# Patient Record
Sex: Female | Born: 1950 | Race: White | Hispanic: No | Marital: Single | Smoking: Never smoker
Health system: Southern US, Community
[De-identification: ages and names within clinical notes are randomized; demographics above are authoritative.]

## PROBLEM LIST (undated history)

## (undated) DIAGNOSIS — E785 Hyperlipidemia, unspecified: Secondary | ICD-10-CM

## (undated) DIAGNOSIS — M8580 Other specified disorders of bone density and structure, unspecified site: Secondary | ICD-10-CM

## (undated) DIAGNOSIS — C801 Malignant (primary) neoplasm, unspecified: Secondary | ICD-10-CM

## (undated) DIAGNOSIS — I10 Essential (primary) hypertension: Secondary | ICD-10-CM

## (undated) DIAGNOSIS — R7303 Prediabetes: Secondary | ICD-10-CM

## (undated) HISTORY — PX: KNEE SURGERY: SHX244

## (undated) HISTORY — PX: EXCISIONAL HEMORRHOIDECTOMY: SHX1541

## (undated) HISTORY — DX: Prediabetes: R73.03

## (undated) HISTORY — DX: Malignant (primary) neoplasm, unspecified: C80.1

## (undated) HISTORY — DX: Essential (primary) hypertension: I10

## (undated) HISTORY — DX: Hyperlipidemia, unspecified: E78.5

## (undated) HISTORY — DX: Other specified disorders of bone density and structure, unspecified site: M85.80

---

## 1997-12-27 ENCOUNTER — Other Ambulatory Visit: Admission: RE | Admit: 1997-12-27 | Discharge: 1997-12-27 | Payer: Self-pay | Admitting: *Deleted

## 1999-01-11 ENCOUNTER — Other Ambulatory Visit: Admission: RE | Admit: 1999-01-11 | Discharge: 1999-01-11 | Payer: Self-pay | Admitting: *Deleted

## 1999-01-24 ENCOUNTER — Encounter: Payer: Self-pay | Admitting: Internal Medicine

## 1999-01-24 ENCOUNTER — Encounter: Admission: RE | Admit: 1999-01-24 | Discharge: 1999-01-24 | Payer: Self-pay | Admitting: Internal Medicine

## 1999-03-01 ENCOUNTER — Other Ambulatory Visit: Admission: RE | Admit: 1999-03-01 | Discharge: 1999-03-01 | Payer: Self-pay | Admitting: *Deleted

## 1999-08-28 ENCOUNTER — Other Ambulatory Visit: Admission: RE | Admit: 1999-08-28 | Discharge: 1999-08-28 | Payer: Self-pay | Admitting: *Deleted

## 2000-01-18 ENCOUNTER — Other Ambulatory Visit: Admission: RE | Admit: 2000-01-18 | Discharge: 2000-01-18 | Payer: Self-pay | Admitting: *Deleted

## 2000-01-26 ENCOUNTER — Encounter: Admission: RE | Admit: 2000-01-26 | Discharge: 2000-01-26 | Payer: Self-pay | Admitting: Internal Medicine

## 2000-01-26 ENCOUNTER — Encounter: Payer: Self-pay | Admitting: Internal Medicine

## 2001-01-20 ENCOUNTER — Other Ambulatory Visit: Admission: RE | Admit: 2001-01-20 | Discharge: 2001-01-20 | Payer: Self-pay | Admitting: *Deleted

## 2001-01-27 ENCOUNTER — Encounter: Payer: Self-pay | Admitting: *Deleted

## 2001-01-27 ENCOUNTER — Encounter: Admission: RE | Admit: 2001-01-27 | Discharge: 2001-01-27 | Payer: Self-pay | Admitting: *Deleted

## 2002-01-22 ENCOUNTER — Other Ambulatory Visit: Admission: RE | Admit: 2002-01-22 | Discharge: 2002-01-22 | Payer: Self-pay | Admitting: *Deleted

## 2002-02-10 ENCOUNTER — Encounter: Admission: RE | Admit: 2002-02-10 | Discharge: 2002-02-10 | Payer: Self-pay | Admitting: *Deleted

## 2002-02-10 ENCOUNTER — Encounter: Payer: Self-pay | Admitting: *Deleted

## 2003-01-25 ENCOUNTER — Other Ambulatory Visit: Admission: RE | Admit: 2003-01-25 | Discharge: 2003-01-25 | Payer: Self-pay | Admitting: *Deleted

## 2003-11-17 ENCOUNTER — Ambulatory Visit: Payer: Self-pay | Admitting: Gastroenterology

## 2004-01-19 ENCOUNTER — Ambulatory Visit: Payer: Self-pay | Admitting: Gastroenterology

## 2004-02-15 ENCOUNTER — Other Ambulatory Visit: Admission: RE | Admit: 2004-02-15 | Discharge: 2004-02-15 | Payer: Self-pay | Admitting: *Deleted

## 2005-03-05 ENCOUNTER — Other Ambulatory Visit: Admission: RE | Admit: 2005-03-05 | Discharge: 2005-03-05 | Payer: Self-pay | Admitting: *Deleted

## 2006-03-20 ENCOUNTER — Other Ambulatory Visit: Admission: RE | Admit: 2006-03-20 | Discharge: 2006-03-20 | Payer: Self-pay | Admitting: *Deleted

## 2007-03-24 ENCOUNTER — Other Ambulatory Visit: Admission: RE | Admit: 2007-03-24 | Discharge: 2007-03-24 | Payer: Self-pay | Admitting: *Deleted

## 2010-01-28 ENCOUNTER — Encounter: Payer: Self-pay | Admitting: *Deleted

## 2011-01-19 ENCOUNTER — Encounter: Payer: Self-pay | Admitting: Gastroenterology

## 2014-01-08 DIAGNOSIS — C801 Malignant (primary) neoplasm, unspecified: Secondary | ICD-10-CM

## 2014-01-08 HISTORY — DX: Malignant (primary) neoplasm, unspecified: C80.1

## 2014-01-11 ENCOUNTER — Encounter: Payer: Self-pay | Admitting: Gastroenterology

## 2014-03-04 ENCOUNTER — Ambulatory Visit (AMBULATORY_SURGERY_CENTER): Payer: Self-pay

## 2014-03-04 VITALS — Ht 58.5 in | Wt 140.0 lb

## 2014-03-04 DIAGNOSIS — Z800 Family history of malignant neoplasm of digestive organs: Secondary | ICD-10-CM

## 2014-03-04 MED ORDER — NA SULFATE-K SULFATE-MG SULF 17.5-3.13-1.6 GM/177ML PO SOLN: 1.0000 | Freq: Once | ORAL | Status: DC

## 2014-03-04 NOTE — Progress Notes (Signed)
No allergies to eggs or soy No home oxygen No diet/weight loss meds No past problems with anesthesia  No internet 

## 2014-03-18 ENCOUNTER — Encounter: Payer: Self-pay | Admitting: Gastroenterology

## 2014-03-18 ENCOUNTER — Ambulatory Visit (AMBULATORY_SURGERY_CENTER): Payer: 59 | Admitting: Gastroenterology

## 2014-03-18 VITALS — BP 90/58 | HR 73 | Temp 98.0°F | Resp 16 | Ht <= 58 in | Wt 140.0 lb

## 2014-03-18 DIAGNOSIS — Z800 Family history of malignant neoplasm of digestive organs: Secondary | ICD-10-CM

## 2014-03-18 DIAGNOSIS — K5730 Diverticulosis of large intestine without perforation or abscess without bleeding: Secondary | ICD-10-CM

## 2014-03-18 DIAGNOSIS — Z1211 Encounter for screening for malignant neoplasm of colon: Secondary | ICD-10-CM

## 2014-03-18 MED ORDER — SODIUM CHLORIDE 0.9 % IV SOLN: 500.0000 mL | INTRAVENOUS | Status: DC

## 2014-03-18 NOTE — Patient Instructions (Signed)
YOU HAD AN ENDOSCOPIC PROCEDURE TODAY AT THE [facility]:   Refer to the procedure report that was given to you for any specific questions about what was found during the examination.  If the procedure report does not answer your questions, please call your gastroenterologist to clarify.  If you requested that your care partner not be given the details of your procedure findings, then the procedure report has been included in a sealed envelope for you to review at your convenience later.  YOU SHOULD EXPECT: Some feelings of bloating in the abdomen. Passage of more gas than usual.  Walking can help get rid of the air that was put into your GI tract during the procedure and reduce the bloating. If you had a lower endoscopy (such as a colonoscopy or flexible sigmoidoscopy) you may notice spotting of blood in your stool or on the toilet paper. If you underwent a bowel prep for your procedure, you may not have a normal bowel movement for a few days.  Please Note:  You might notice some irritation and congestion in your nose or some drainage.  This is from the oxygen used during your procedure.  There is no need for concern and it should clear up in a day or so.  SYMPTOMS TO REPORT IMMEDIATELY:   Following lower endoscopy (colonoscopy or flexible sigmoidoscopy):  Excessive amounts of blood in the stool  Significant tenderness or worsening of abdominal pains  Swelling of the abdomen that is new, acute  Fever of 100F or higher    For urgent or emergent issues, a gastroenterologist can be reached at any hour by calling [phone].   DIET: Your first meal following the procedure should be a small meal and then it is ok to progress to your normal diet. Heavy or fried foods are harder to digest and may make you feel nauseous or bloated.  Likewise, meals heavy in dairy and vegetables can increase bloating.  Drink plenty of fluids but you should avoid alcoholic beverages for 24  hours.  ACTIVITY:  You should plan to take it easy for the rest of today and you should NOT DRIVE or use heavy machinery until tomorrow (because of the sedation medicines used during the test).    FOLLOW UP: Our staff will call the number listed on your records the next business day following your procedure to check on you and address any questions or concerns that you may have regarding the information given to you following your procedure. If we do not reach you, we will leave a message.  However, if you are feeling well and you are not experiencing any problems, there is no need to return our call.  We will assume that you have returned to your regular daily activities without incident.  If any biopsies were taken you will be contacted by phone or by letter within the next 1-3 weeks.  Please call us at [phone] if you have not heard about the biopsies in 3 weeks.    SIGNATURES/CONFIDENTIALITY: You and/or your care partner have signed paperwork which will be entered into your electronic medical record.  These signatures attest to the fact that that the information above on your After Visit Summary has been reviewed and is understood.  Full responsibility of the confidentiality of this discharge information lies with you and/or your care-partner.   INFORMATION ON DIVERTICULOSIS GIVEN TO YOU TODAY 

## 2014-03-18 NOTE — Op Note (Signed)
[place]  [address] [place], [id number]   COLONOSCOPY PROCEDURE REPORT  PATIENT: Caitlin, Price  MR#: [id number] BIRTHDATE: October 16, 1950 , 83  yrs. old GENDER: female ENDOSCOPIST: Inda Castle, MD REFERRED BY: PROCEDURE DATE:  03/18/2014 PROCEDURE:   Colonoscopy, diagnostic First Screening Colonoscopy - Avg.  risk and is 50 yrs.  old or older - No.  Prior Negative Screening - Now for repeat screening. 10 or more years since last screening  History of Adenoma - Now for follow-up colonoscopy & has been > or = to 3 yrs.  N/A ASA CLASS:   Class II INDICATIONS:Colorectal Neoplasm Risk Assessment for this procedure is average risk. MEDICATIONS: Monitored anesthesia care and Propofol 300 mg IV  DESCRIPTION OF PROCEDURE:   After the risks benefits and alternatives of the procedure were thoroughly explained, informed consent was obtained.  The digital rectal exam revealed no abnormalities of the rectum.   The LB [id number]  endoscope was introduced through the anus and advanced to the cecum, which was identified by both the appendix and ileocecal valve. No adverse events experienced.   The quality of the prep was (Suprep was used) excellent.  The instrument was then slowly withdrawn as the colon was fully examined.      COLON FINDINGS: There was mild diverticulosis noted in the sigmoid colon.   The examination was otherwise normal.  Retroflexed views revealed no abnormalities. The time to cecum = 3.2 Withdrawal time = 6.8   The scope was withdrawn and the procedure completed. COMPLICATIONS: There were no immediate complications.  ENDOSCOPIC IMPRESSION: 1.   Mild diverticulosis was noted in the sigmoid colon 2.   The examination was otherwise normal  RECOMMENDATIONS: Continue current colorectal screening recommendations for "routine risk" patients with a repeat colonoscopy in 10 years.  eSigned:  Inda Castle, MD 03/18/2014 2:01 PM   cc:  Wenda Low, MD

## 2014-03-18 NOTE — Progress Notes (Signed)
Stable to RR 

## 2015-10-13 ENCOUNTER — Ambulatory Visit: Payer: Self-pay | Admitting: Allergy & Immunology

## 2015-10-14 ENCOUNTER — Ambulatory Visit: Payer: Self-pay | Admitting: Allergy

## 2015-11-07 ENCOUNTER — Ambulatory Visit: Payer: Self-pay | Admitting: Allergy and Immunology

## 2016-08-29 ENCOUNTER — Ambulatory Visit (INDEPENDENT_AMBULATORY_CARE_PROVIDER_SITE_OTHER): Payer: BLUE CROSS/BLUE SHIELD | Admitting: Obstetrics & Gynecology

## 2016-08-29 ENCOUNTER — Encounter: Payer: Self-pay | Admitting: Obstetrics & Gynecology

## 2016-08-29 VITALS — BP 138/86 | Ht 59.0 in | Wt 138.0 lb

## 2016-08-29 DIAGNOSIS — N952 Postmenopausal atrophic vaginitis: Secondary | ICD-10-CM | POA: Diagnosis not present

## 2016-08-29 DIAGNOSIS — Z01411 Encounter for gynecological examination (general) (routine) with abnormal findings: Secondary | ICD-10-CM | POA: Diagnosis not present

## 2016-08-29 DIAGNOSIS — Z780 Asymptomatic menopausal state: Secondary | ICD-10-CM

## 2016-08-29 NOTE — Patient Instructions (Signed)
1. Encounter for gynecological examination with abnormal finding Gyn exam with Atrophic vaginitis.  Pap normal 08/2015.  Breasts wnl.  Screening Mammo neg 07/2016 at [facility].  Up to date with Colonoscopy.  2. Menopause present No HRT.  No PMB.  Osteopenia bilateral hips on BD 07/2015.  Takes Vit D supplements.  Ca++ in nutrition recommended.  Weight bearing physical activity.  3. Post-menopausal atrophic vaginitis Asymptomatic.  Abstinent.  Caitlin Price, it was a pleasure to see you today!   Health Maintenance for Postmenopausal Women Menopause is a normal process in which your reproductive ability comes to an end. This process happens gradually over a span of months to years, usually between the ages of 104 and 92. Menopause is complete when you have missed 12 consecutive menstrual periods. It is important to talk with your health care provider about some of the most common conditions that affect postmenopausal women, such as heart disease, cancer, and bone loss (osteoporosis). Adopting a healthy lifestyle and getting preventive care can help to promote your health and wellness. Those actions can also lower your chances of developing some of these common conditions. What should I know about menopause? During menopause, you may experience a number of symptoms, such as:  Moderate-to-severe hot flashes.  Night sweats.  Decrease in sex drive.  Mood swings.  Headaches.  Tiredness.  Irritability.  Memory problems.  Insomnia.  Choosing to treat or not to treat menopausal changes is an individual decision that you make with your health care provider. What should I know about hormone replacement therapy and supplements? Hormone therapy products are effective for treating symptoms that are associated with menopause, such as hot flashes and night sweats. Hormone replacement carries certain risks, especially as you become older. If you are thinking about using estrogen or estrogen with progestin  treatments, discuss the benefits and risks with your health care provider. What should I know about heart disease and stroke? Heart disease, heart attack, and stroke become more likely as you age. This may be due, in part, to the hormonal changes that your body experiences during menopause. These can affect how your body processes dietary fats, triglycerides, and cholesterol. Heart attack and stroke are both medical emergencies. There are many things that you can do to help prevent heart disease and stroke:  Have your blood pressure checked at least every 1-2 years. High blood pressure causes heart disease and increases the risk of stroke.  If you are 37-28 years old, ask your health care provider if you should take aspirin to prevent a heart attack or a stroke.  Do not use any tobacco products, including cigarettes, chewing tobacco, or electronic cigarettes. If you need help quitting, ask your health care provider.  It is important to eat a healthy diet and maintain a healthy weight. ? Be sure to include plenty of vegetables, fruits, low-fat dairy products, and lean protein. ? Avoid eating foods that are high in solid fats, added sugars, or salt (sodium).  Get regular exercise. This is one of the most important things that you can do for your health. ? Try to exercise for at least 150 minutes each week. The type of exercise that you do should increase your heart rate and make you sweat. This is known as moderate-intensity exercise. ? Try to do strengthening exercises at least twice each week. Do these in addition to the moderate-intensity exercise.  Know your numbers.Ask your health care provider to check your cholesterol and your blood glucose. Continue to have your  blood tested as directed by your health care provider.  What should I know about cancer screening? There are several types of cancer. Take the following steps to reduce your risk and to catch any cancer development as early as  possible. Breast Cancer  Practice breast self-awareness. ? This means understanding how your breasts normally appear and feel. ? It also means doing regular breast self-exams. Let your health care provider know about any changes, no matter how small.  If you are 36 or older, have a clinician do a breast exam (clinical breast exam or CBE) every year. Depending on your age, family history, and medical history, it may be recommended that you also have a yearly breast X-ray (mammogram).  If you have a family history of breast cancer, talk with your health care provider about genetic screening.  If you are at high risk for breast cancer, talk with your health care provider about having an MRI and a mammogram every year.  Breast cancer (BRCA) gene test is recommended for women who have family members with BRCA-related cancers. Results of the assessment will determine the need for genetic counseling and BRCA1 and for BRCA2 testing. BRCA-related cancers include these types: ? Breast. This occurs in males or females. ? Ovarian. ? Tubal. This may also be called fallopian tube cancer. ? Cancer of the abdominal or pelvic lining (peritoneal cancer). ? Prostate. ? Pancreatic.  Cervical, Uterine, and Ovarian Cancer Your health care provider may recommend that you be screened regularly for cancer of the pelvic organs. These include your ovaries, uterus, and vagina. This screening involves a pelvic exam, which includes checking for microscopic changes to the surface of your cervix (Pap test).  For women ages 21-65, health care providers may recommend a pelvic exam and a Pap test every three years. For women ages 27-65, they may recommend the Pap test and pelvic exam, combined with testing for human papilloma virus (HPV), every five years. Some types of HPV increase your risk of cervical cancer. Testing for HPV may also be done on women of any age who have unclear Pap test results.  Other health care  providers may not recommend any screening for nonpregnant women who are considered low risk for pelvic cancer and have no symptoms. Ask your health care provider if a screening pelvic exam is right for you.  If you have had past treatment for cervical cancer or a condition that could lead to cancer, you need Pap tests and screening for cancer for at least 20 years after your treatment. If Pap tests have been discontinued for you, your risk factors (such as having a new sexual partner) need to be reassessed to determine if you should start having screenings again. Some women have medical problems that increase the chance of getting cervical cancer. In these cases, your health care provider may recommend that you have screening and Pap tests more often.  If you have a family history of uterine cancer or ovarian cancer, talk with your health care provider about genetic screening.  If you have vaginal bleeding after reaching menopause, tell your health care provider.  There are currently no reliable tests available to screen for ovarian cancer.  Lung Cancer Lung cancer screening is recommended for adults 58-20 years old who are at high risk for lung cancer because of a history of smoking. A yearly low-dose CT scan of the lungs is recommended if you:  Currently smoke.  Have a history of at least 30 pack-years of smoking  and you currently smoke or have quit within the past 15 years. A pack-year is smoking an average of one pack of cigarettes per day for one year.  Yearly screening should:  Continue until it has been 15 years since you quit.  Stop if you develop a health problem that would prevent you from having lung cancer treatment.  Colorectal Cancer  This type of cancer can be detected and can often be prevented.  Routine colorectal cancer screening usually begins at age 86 and continues through age 34.  If you have risk factors for colon cancer, your health care provider may recommend  that you be screened at an earlier age.  If you have a family history of colorectal cancer, talk with your health care provider about genetic screening.  Your health care provider may also recommend using home test kits to check for hidden blood in your stool.  A small camera at the end of a tube can be used to examine your colon directly (sigmoidoscopy or colonoscopy). This is done to check for the earliest forms of colorectal cancer.  Direct examination of the colon should be repeated every 5-10 years until age 51. However, if early forms of precancerous polyps or small growths are found or if you have a family history or genetic risk for colorectal cancer, you may need to be screened more often.  Skin Cancer  Check your skin from head to toe regularly.  Monitor any moles. Be sure to tell your health care provider: ? About any new moles or changes in moles, especially if there is a change in a mole's shape or color. ? If you have a mole that is larger than the size of a pencil eraser.  If any of your family members has a history of skin cancer, especially at a young age, talk with your health care provider about genetic screening.  Always use sunscreen. Apply sunscreen liberally and repeatedly throughout the day.  Whenever you are outside, protect yourself by wearing long sleeves, pants, a wide-brimmed hat, and sunglasses.  What should I know about osteoporosis? Osteoporosis is a condition in which bone destruction happens more quickly than new bone creation. After menopause, you may be at an increased risk for osteoporosis. To help prevent osteoporosis or the bone fractures that can happen because of osteoporosis, the following is recommended:  If you are 55-31 years old, get at least 1,000 mg of calcium and at least 600 mg of vitamin D per day.  If you are older than age 53 but younger than age 22, get at least 1,200 mg of calcium and at least 600 mg of vitamin D per day.  If you  are older than age 60, get at least 1,200 mg of calcium and at least 800 mg of vitamin D per day.  Smoking and excessive alcohol intake increase the risk of osteoporosis. Eat foods that are rich in calcium and vitamin D, and do weight-bearing exercises several times each week as directed by your health care provider. What should I know about how menopause affects my mental health? Depression may occur at any age, but it is more common as you become older. Common symptoms of depression include:  Low or sad mood.  Changes in sleep patterns.  Changes in appetite or eating patterns.  Feeling an overall lack of motivation or enjoyment of activities that you previously enjoyed.  Frequent crying spells.  Talk with your health care provider if you think that you are experiencing  depression. What should I know about immunizations? It is important that you get and maintain your immunizations. These include:  Tetanus, diphtheria, and pertussis (Tdap) booster vaccine.  Influenza every year before the flu season begins.  Pneumonia vaccine.  Shingles vaccine.  Your health care provider may also recommend other immunizations. This information is not intended to replace advice given to you by your health care provider. Make sure you discuss any questions you have with your health care provider. Document Released: 02/16/2005 Document Revised: 07/15/2015 Document Reviewed: 09/28/2014 Elsevier Interactive Patient Education  2018 [employer].

## 2016-08-29 NOTE — Progress Notes (Signed)
Caitlin Price 1950/04/06 [id number]   History:    66 y.o. G0 single.  Abstinent.  Retired.  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  RP:  Established patient presenting for annual gyn exam  HPI:  Long standing Menopause.  No HRT.  No PMB.  Asymptomatic.  No pelvic pain.  Breasts wnl.  Mictions normal.  BMs wnl.  Walks her dogs.   Past medical history,surgical history, family history and social history were all reviewed and documented in the EPIC chart.  Gynecologic History No LMP recorded. Patient is postmenopausal. Contraception: abstinence and post menopausal status Last Pap: 08/2015. Results were: normal Last mammogram: 07/2016. Results were: Negative Bone Density Osteopenia at hips 07/2015 [facility] up to date  Obstetric History OB History  Gravida Para Term Preterm AB Living  0 0 0 0 0 0  SAB TAB Ectopic Multiple Live Births  0 0 0 0 0         ROS: A ROS was performed and pertinent positives and negatives are included in the history.  GENERAL: No fevers or chills. HEENT: No change in vision, no earache, sore throat or sinus congestion. NECK: No pain or stiffness. CARDIOVASCULAR: No chest pain or pressure. No palpitations. PULMONARY: No shortness of breath, cough or wheeze. GASTROINTESTINAL: No abdominal pain, nausea, vomiting or diarrhea, melena or bright red blood per rectum. GENITOURINARY: No urinary frequency, urgency, hesitancy or dysuria. MUSCULOSKELETAL: No joint or muscle pain, no back pain, no recent trauma. DERMATOLOGIC: No rash, no itching, no lesions. ENDOCRINE: No polyuria, polydipsia, no heat or cold intolerance. No recent change in weight. HEMATOLOGICAL: No anemia or easy bruising or bleeding. NEUROLOGIC: No headache, seizures, numbness, tingling or weakness. PSYCHIATRIC: No depression, no loss of interest in normal activity or change in sleep pattern.     Exam:   BP 138/86   Ht 4\' 11"  (1.499 m)   Wt 138 lb (62.6 kg)   BMI 27.87 kg/m   Body mass index is  27.87 kg/m.  General appearance : Well developed well nourished female. No acute distress HEENT: Eyes: no retinal hemorrhage or exudates,  Neck supple, trachea midline, no carotid bruits, no thyroidmegaly Lungs: Clear to auscultation, no rhonchi or wheezes, or rib retractions  Heart: Regular rate and rhythm, no murmurs or gallops Breast:Examined in sitting and supine position were symmetrical in appearance, no palpable masses or tenderness,  no skin retraction, no nipple inversion, no nipple discharge, no skin discoloration, no axillary or supraclavicular lymphadenopathy Abdomen: no palpable masses or tenderness, no rebound or guarding Extremities: no edema or skin discoloration or tenderness  Pelvic: Vulva mild atrophy   Bartholin, Urethra, Skene Glands: Within normal limits             Vagina: No gross lesions or discharge  Cervix: No gross lesions or discharge  Uterus  AV, normal size, shape and consistency, non-tender and mobile  Adnexa  Without masses or tenderness  Anus and perineum  normal    Assessment/Plan:  66 y.o. female for annual exam   1. Encounter for gynecological examination with abnormal finding Gyn exam with Atrophic vaginitis.  Pap normal 08/2015.  Breasts wnl.  Screening Mammo neg 07/2016 at [facility].  Up to date with Colonoscopy.  2. Menopause present No HRT.  No PMB.  Osteopenia bilateral hips on BD 07/2015.  Takes Vit D supplements.  Ca++ in nutrition recommended.  Weight bearing physical activity.  3. Post-menopausal atrophic vaginitis Asymptomatic.  Abstinent.  Princess Bruins MD, 10:12  AM 08/29/2016

## 2017-01-22 ENCOUNTER — Other Ambulatory Visit (INDEPENDENT_AMBULATORY_CARE_PROVIDER_SITE_OTHER): Payer: Self-pay

## 2017-01-22 ENCOUNTER — Ambulatory Visit (INDEPENDENT_AMBULATORY_CARE_PROVIDER_SITE_OTHER): Payer: BLUE CROSS/BLUE SHIELD | Admitting: Orthopaedic Surgery

## 2017-01-22 ENCOUNTER — Encounter (INDEPENDENT_AMBULATORY_CARE_PROVIDER_SITE_OTHER): Payer: Self-pay | Admitting: Orthopaedic Surgery

## 2017-01-22 DIAGNOSIS — M25511 Pain in right shoulder: Secondary | ICD-10-CM

## 2017-01-22 DIAGNOSIS — G8929 Other chronic pain: Secondary | ICD-10-CM | POA: Diagnosis not present

## 2017-01-22 NOTE — Progress Notes (Signed)
Office Visit Note   Patient: Caitlin Price           Date of Birth: 08/10/1950           MRN: [id number] Visit Date: 01/22/2017              Requested by: Wenda Low, MD [address] [place], [place] [address] PCP: Wenda Low, MD   Assessment & Plan: Visit Diagnoses:  1. Chronic right shoulder pain     Plan: Due to her significant right dominant shoulder weakness and concern of a full-thickness rotator cuff tear on clinical exam we need an MRI at this point to rule out a rotator cuff tear of the right shoulder.  She is already tried and failed conservative treatment measures including rest, activity modification, ice, heat, anti-inflammatories, home exercise program and a steroid injection.  I do feel based on clinical exam this is clinically medically warranted.  She agrees with this as well.  All questions concerns were answered and addressed.  We will see her back in 2 weeks and hopefully MRI will be obtained of her right shoulder by that visit that we can go over and then come up with the treatment plan.  Follow-Up Instructions: Return in about 2 weeks (around 02/05/2017).   Orders:  No orders of the defined types were placed in this encounter.  No orders of the defined types were placed in this encounter.     Procedures: No procedures performed   Clinical Data: No additional findings.   Subjective: Chief Complaint  Patient presents with  . Right Shoulder - Pain  Patient is coming for mainly a second opinion but also further evaluation and treatment of chronic right shoulder pain.  She says in July of this past year which was 6 months ago an acute injury to this right shoulder trying to catch a ladder from falling back.  Since then she has had pain with overhead activities and reaching behind her.  She went to another orthopedic office in town and in October had a subacromial steroid injection which did help for a few weeks.  Her pain though is  back in her weakness is worsening.  She is tried home exercise program as well as anti-inflammatories and she is a very active individual and is not getting better for her.  She denies any numbness and tingling going down into her hand on the right side denies any neck pain.  She is a very active person.  HPI  Review of Systems She currently denies any headache, chest pain, shortness of breath, fever, chills, nausea, vomiting.  Objective: Vital Signs: There were no vitals taken for this visit.  Physical Exam She is alert and oriented x3 and in no acute distress Ortho Exam Examination of her right shoulder does show limitations with abduction.  She is more of her deltoids to abduct her shoulder than the rotator cuff itself.  She has positive Neer and Hawkins signs and weakness of the rotator cuff with abduction and external rotation on the right side.  He Specialty Comments:  No specialty comments available.  Imaging: No results found. 3 views of her right shoulder independently reviewed shows a well located shoulder with no acute findings.  There is some mild acromioclavicular arthritic changes.  PMFS History: Patient Active Problem List   Diagnosis Date Noted  . Chronic right shoulder pain 01/22/2017   Past Medical History:  Diagnosis Date  . Cancer ([place]) 2016  skin cancer- forehead   . Hyperlipidemia   . Hypertension   . Osteopenia     Family History  Problem Relation Age of Onset  . Colon cancer Maternal Uncle   . Heart attack Mother   . Heart attack Father   . Diabetes Sister   . Diabetes Maternal Grandfather   . Diabetes Paternal Grandmother     Past Surgical History:  Procedure Laterality Date  . EXCISIONAL HEMORRHOIDECTOMY     Social History   Occupational History  . Not on file  Tobacco Use  . Smoking status: Never Smoker  . Smokeless tobacco: Never Used  Substance and Sexual Activity  . Alcohol use: No    Alcohol/week: 0.0 oz  . Drug use: No  .  Sexual activity: Not Currently    Comment: 1st intercourse- 22, partners-  1,

## 2017-01-24 ENCOUNTER — Telehealth (INDEPENDENT_AMBULATORY_CARE_PROVIDER_SITE_OTHER): Payer: Self-pay | Admitting: Orthopaedic Surgery

## 2017-01-24 NOTE — Telephone Encounter (Signed)
Patient called left a voicemail saying theres an additional area that she would like added to the MRI, she did not specify. Her CB # [phone]

## 2017-01-24 NOTE — Telephone Encounter (Signed)
LMOM for patient letting her know that we can not add on another body part without seeing her for that area first for insurance purposes

## 2017-02-03 ENCOUNTER — Ambulatory Visit
Admission: RE | Admit: 2017-02-03 | Discharge: 2017-02-03 | Disposition: A | Discharge status: Home / Self Care | Payer: BLUE CROSS/BLUE SHIELD | Source: Ambulatory Visit | Attending: Orthopaedic Surgery | Admitting: Orthopaedic Surgery

## 2017-02-03 DIAGNOSIS — G8929 Other chronic pain: Secondary | ICD-10-CM

## 2017-02-03 DIAGNOSIS — M25511 Pain in right shoulder: Principal | ICD-10-CM

## 2017-02-05 ENCOUNTER — Ambulatory Visit (INDEPENDENT_AMBULATORY_CARE_PROVIDER_SITE_OTHER): Payer: BLUE CROSS/BLUE SHIELD | Admitting: Orthopaedic Surgery

## 2017-02-05 ENCOUNTER — Encounter (INDEPENDENT_AMBULATORY_CARE_PROVIDER_SITE_OTHER): Payer: Self-pay | Admitting: Orthopaedic Surgery

## 2017-02-05 DIAGNOSIS — M75111 Incomplete rotator cuff tear or rupture of right shoulder, not specified as traumatic: Secondary | ICD-10-CM

## 2017-02-05 DIAGNOSIS — G8929 Other chronic pain: Secondary | ICD-10-CM | POA: Diagnosis not present

## 2017-02-05 DIAGNOSIS — M25511 Pain in right shoulder: Secondary | ICD-10-CM

## 2017-02-05 DIAGNOSIS — M7541 Impingement syndrome of right shoulder: Secondary | ICD-10-CM | POA: Insufficient documentation

## 2017-02-05 NOTE — Progress Notes (Signed)
The patient comes in for review of an MRI of her right shoulder.  She has chronic right shoulder pain and is right-hand dominant.  She is very active 67 year old and does a lot of heavy outdoor work.  Her shoulder hurts with overhead activities and reaching behind her.  It is become painful it wakes her up at night.  She only had subacromial steroid injection in her right shoulder and that did not help much.  On exam she does have pain with overhead activities.  Her liftoff is negative.  She has positive Neer and Hawkins signs.  There is a lot of pain around the [facility] joint as well as a subacromial outlet.  There is some slight weakness in external rotation of the rotator cuff.  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  There is subdeltoid and subacromial fluid and significant tendinopathy rotator cuff as well as a near full-thickness tear at the insertion.  This point we are recommending arthroscopic intervention with subacromial decompression with extensive debridement and possibly rotator cuff repair depending on the integrity of the rotator cuff fibers.  She understands this fully.  We showed her shoulder model explained what the surgery involves as well as her intraoperative and postoperative course.  We gave her our surgery schedulers card because she said that she would like to talk to her family about this and let us know.  All questions concerns were answered and addressed.

## 2017-02-08 ENCOUNTER — Telehealth (INDEPENDENT_AMBULATORY_CARE_PROVIDER_SITE_OTHER): Payer: Self-pay | Admitting: Orthopaedic Surgery

## 2017-02-08 NOTE — Telephone Encounter (Signed)
Patient called stating that she has left several message about getting her surgery scheduled.  CB#[phone].  Thank you.

## 2017-02-15 ENCOUNTER — Telehealth (INDEPENDENT_AMBULATORY_CARE_PROVIDER_SITE_OTHER): Payer: Self-pay

## 2017-02-15 NOTE — Telephone Encounter (Signed)
Patient would like a call concerning some information that she would like to discuss with Dr. Ninfa Linden concerning her right arm injury.  Cb# is [phone].  Please advise.  Thank you.

## 2017-02-18 NOTE — Telephone Encounter (Signed)
States she has some stinging in her upper arm  Wanted to make you aware

## 2017-02-18 NOTE — Telephone Encounter (Signed)
The symptoms she is experiencing are likely related to her shoulder and the MRI findings does show significant bursitis in the shoulder as well as the injury to the rotator cuff.  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  We will try to address all of these at the time of her surgery on her shoulder.

## 2017-02-19 NOTE — Telephone Encounter (Signed)
Patient aware of the below message  

## 2017-02-21 ENCOUNTER — Telehealth (INDEPENDENT_AMBULATORY_CARE_PROVIDER_SITE_OTHER): Payer: Self-pay | Admitting: *Deleted

## 2017-02-21 DIAGNOSIS — M7551 Bursitis of right shoulder: Secondary | ICD-10-CM | POA: Diagnosis not present

## 2017-02-21 DIAGNOSIS — M19011 Primary osteoarthritis, right shoulder: Secondary | ICD-10-CM | POA: Diagnosis not present

## 2017-02-21 DIAGNOSIS — M75121 Complete rotator cuff tear or rupture of right shoulder, not specified as traumatic: Secondary | ICD-10-CM | POA: Diagnosis not present

## 2017-02-21 HISTORY — PX: ROTATOR CUFF REPAIR: SHX139

## 2017-02-21 NOTE — Telephone Encounter (Signed)
Caitlin Price from [facility] received RX for oxycodone 5mg  and states she sees pt has never been on this before and wants to know if she can add max 6 tablets a day. Please advise  [phone]

## 2017-02-21 NOTE — Telephone Encounter (Signed)
LMOM for pharmacy letting them know this is ok to add

## 2017-03-04 ENCOUNTER — Inpatient Hospital Stay (INDEPENDENT_AMBULATORY_CARE_PROVIDER_SITE_OTHER): Payer: BLUE CROSS/BLUE SHIELD | Admitting: Orthopaedic Surgery

## 2017-03-04 ENCOUNTER — Ambulatory Visit (INDEPENDENT_AMBULATORY_CARE_PROVIDER_SITE_OTHER): Payer: BLUE CROSS/BLUE SHIELD | Admitting: Orthopaedic Surgery

## 2017-03-04 ENCOUNTER — Encounter (INDEPENDENT_AMBULATORY_CARE_PROVIDER_SITE_OTHER): Payer: Self-pay | Admitting: Orthopaedic Surgery

## 2017-03-04 DIAGNOSIS — Z9889 Other specified postprocedural states: Secondary | ICD-10-CM | POA: Insufficient documentation

## 2017-03-04 NOTE — Progress Notes (Signed)
The patient is a week and half status post a right shoulder arthroscopy with a rotator cuff repair.  She feels like she is ready for outpatient therapy.  She is been compliant with wearing her sling and doing little with her shoulder in terms of limited activities.  On exam her sutures look intact and we remove these.  Her shoulder is well located in her axillary nerve is functioning.  It is painful to try to get her through abduction and external rotation of her shoulder.  Outpatient therapy will be essential to help decrease her shoulder pain and improve her shoulder function with range of motion and strengthening as well.  She will continue the shoulder abduction sling for at least 2 more weeks.  We will work on setting up outpatient physical therapy.  I will see her back in 4 weeks to see how she is doing overall.  All questions were answered and addressed.

## 2017-03-13 ENCOUNTER — Telehealth (INDEPENDENT_AMBULATORY_CARE_PROVIDER_SITE_OTHER): Payer: Self-pay | Admitting: Orthopaedic Surgery

## 2017-03-13 ENCOUNTER — Other Ambulatory Visit (INDEPENDENT_AMBULATORY_CARE_PROVIDER_SITE_OTHER): Payer: Self-pay

## 2017-03-13 DIAGNOSIS — G8929 Other chronic pain: Secondary | ICD-10-CM

## 2017-03-13 DIAGNOSIS — M25511 Pain in right shoulder: Principal | ICD-10-CM

## 2017-03-13 NOTE — Telephone Encounter (Signed)
Order put in for [facility] PT

## 2017-03-13 NOTE — Telephone Encounter (Signed)
PT as an outpatient with [facility] post right shoulder arthroscopy with rotator cuff repair.

## 2017-03-13 NOTE — Telephone Encounter (Signed)
Patient called stating that she has not heard anything in regards to her physical therapy and wanted to know when that was going to start.  CB#[phone].  Thank you.

## 2017-03-13 NOTE — Telephone Encounter (Signed)
Please advise Can you write out another order for PT and I will fax  thanks

## 2017-03-20 ENCOUNTER — Telehealth (INDEPENDENT_AMBULATORY_CARE_PROVIDER_SITE_OTHER): Payer: Self-pay | Admitting: Orthopaedic Surgery

## 2017-03-20 NOTE — Telephone Encounter (Signed)
Patients sister called on behalf of patient asking about her therapy. The rotator cuff surgery was back on 2/14 and they are going to be starting therapy next week and the sister is concerned that it's been too long since it's 5 week after surgery. CB # [phone]

## 2017-03-20 NOTE — Telephone Encounter (Signed)
See below message

## 2017-03-20 NOTE — Telephone Encounter (Signed)
I am not concerned about it being too long since her surgery given the fact that she had a significant tear of the rotator cuff and sometimes therapy can be too aggressive at the beginning and there is a higher rerupture rate following rotator cuff repairs.  Certainly I want her to be in therapy as soon as possible to work on getting her shoulder function to improve but there is really nothing else that we can do other than wait to see how therapy goes.

## 2017-03-21 NOTE — Telephone Encounter (Signed)
LMOM for patient of the below message  

## 2017-03-26 ENCOUNTER — Ambulatory Visit: Payer: BLUE CROSS/BLUE SHIELD | Attending: Orthopaedic Surgery | Admitting: Physical Therapy

## 2017-03-26 ENCOUNTER — Encounter: Payer: Self-pay | Admitting: Physical Therapy

## 2017-03-26 ENCOUNTER — Other Ambulatory Visit: Payer: Self-pay

## 2017-03-26 DIAGNOSIS — M25511 Pain in right shoulder: Secondary | ICD-10-CM | POA: Diagnosis not present

## 2017-03-26 DIAGNOSIS — R600 Localized edema: Secondary | ICD-10-CM | POA: Diagnosis present

## 2017-03-26 DIAGNOSIS — M25611 Stiffness of right shoulder, not elsewhere classified: Secondary | ICD-10-CM | POA: Diagnosis present

## 2017-03-26 DIAGNOSIS — M6281 Muscle weakness (generalized): Secondary | ICD-10-CM | POA: Diagnosis present

## 2017-03-26 DIAGNOSIS — G8929 Other chronic pain: Secondary | ICD-10-CM

## 2017-03-26 NOTE — Therapy (Signed)
[place], [place], [address] Phone: [phone]   Fax:  [phone]  Physical Therapy Evaluation  Patient Details  Name: Caitlin Price MRN: [id number] Date of Birth: 1950-11-26 Referring Provider: Zollie Beckers MD   Encounter Date: 03/26/2017  PT End of Session - 03/26/17 1142    Visit Number  1    Number of Visits  17    Date for PT Re-Evaluation  05/21/17    PT Start Time  1100    PT Stop Time  1143    PT Time Calculation (min)  43 min    Activity Tolerance  Patient tolerated treatment well    Behavior During Therapy  [facility] for tasks assessed/performed       Past Medical History:  Diagnosis Date  . Cancer ([place]) 2016   skin cancer- forehead   . Hyperlipidemia   . Hypertension   . Osteopenia     Past Surgical History:  Procedure Laterality Date  . EXCISIONAL HEMORRHOIDECTOMY    . ROTATOR CUFF REPAIR Right 02/21/2017    There were no vitals filed for this visit.   Subjective Assessment - 03/26/17 1056    Subjective  pt is a 67 y.o F s/p R shoulder RCR on 02/21/2017 due to catching a fiberglass ladder that occurred in middle of July.  Since the surgery she reports being complaint using her brace, and has been careful to avoid using the RUE. pain seems to stay in the front of the shoulder which she reports is very sore. since surgery she reports the soreness has has improved    Limitations  Lifting;Writing;House hold activities    How long can you sit comfortably?  unlimited    How long can you stand comfortably?  unlimite    How long can you walk comfortably?  unlimited    Diagnostic tests  MRI 02/03/2017    Patient Stated Goals  get back to normal, using the R arm     Currently in Pain?  Yes    Pain Score  0-No pain at worst pain get ups to 10/10    Pain Orientation  Right    Pain Descriptors / Indicators  Sore    Pain Type  Surgical pain    Pain Onset  More than a month ago    Pain  Frequency  Intermittent    Aggravating Factors   any shoulder movement or activity    Pain Relieving Factors  sitting and rest    Effect of Pain on Daily Activities  limited RUE movement or use         [facility] PT Assessment - 03/26/17 1057      Assessment   Medical Diagnosis  s/p R RCR     Referring Provider  Zollie Beckers MD    Onset Date/Surgical Date  -- 02/21/2017 surgical date    Hand Dominance  Right    Next MD Visit  -- 05/04/2017    Prior Therapy  no      Precautions   Precautions  Shoulder    Precaution Comments  no lifting RUE over head      Restrictions   Weight Bearing Restrictions  Yes    RUE Weight Bearing  Non weight bearing      Balance Screen   Has the patient fallen in the past 6 months  No    Has the patient had a decrease in activity level because of a fear of falling?  No    Is the patient reluctant to leave their home because of a fear of falling?   No      Home Environment   Living Environment  Private residence    Living Arrangements  Alone    Available Help at Discharge  Family;Available PRN/intermittently    Type of Home  House    Home Access  Level entry    Home Layout  One level    Additional Comments  bolster sling      Prior Function   Level of Independence  Independent    Vocation  Retired      Associate Professor   Overall Cognitive Status  Within Functional Limits for tasks assessed      Observation/Other Assessments   Focus on Therapeutic Outcomes (FOTO)   66% limited 38% limited      Posture/Postural Control   Posture/Postural Control  Postural limitations    Postural Limitations  Rounded Shoulders;Forward head      ROM / Strength   AROM / PROM / Strength  PROM;AROM;Strength      AROM   AROM Assessment Site  Shoulder    Right/Left Shoulder  Right;Left    Right Shoulder Extension  27 Degrees ERP    Right Shoulder Flexion  41 Degrees PDM    Right Shoulder ABduction  43 Degrees    Right Shoulder Internal Rotation  65 Degrees to  stomach assessed with shoulder in netural    Right Shoulder External Rotation  8 Degrees pDM, assessed in nuetral    Left Shoulder Extension  42 Degrees    Left Shoulder Flexion  142 Degrees    Left Shoulder ABduction  126 Degrees    Left Shoulder Internal Rotation  75 Degrees assessed 90/90    Left Shoulder External Rotation  70 Degrees assessed 90/90      PROM   PROM Assessment Site  Shoulder    Right/Left Shoulder  Right    Right Shoulder ABduction  60 Degrees ERP    Right Shoulder Internal Rotation  65 Degrees    Right Shoulder External Rotation  10 Degrees      Strength   Overall Strength Comments  R shoulder not assessed per protocol    Strength Assessment Site  Shoulder;Hand    Right/Left Shoulder  Right;Left    Left Shoulder Flexion  4+/5    Left Shoulder Extension  4+/5    Left Shoulder ABduction  4+/5    Left Shoulder Internal Rotation  4+/5    Left Shoulder External Rotation  4+/5    Right Hand Grip (lbs)  47 48,47,46    Left Hand Grip (lbs)  58.3 62,56,57      Palpation   Palpation comment  TTP along the pec major, and coracoid process              Objective measurements completed on examination: See above findings.      [place] Adult PT Treatment/Exercise - 03/26/17 1057      Shoulder Exercises: Seated   Retraction  Strengthening;Both;10 reps holding 3 sec ea rap      Shoulder Exercises: Stretch   Other Shoulder Stretches  R upper trap stretch 2 x 30 sec             PT Education - 03/26/17 1142    Education provided  Yes    Education Details  evaluation findings, POC, goals, HEP with proper form/ rationale, donning/ doffing sling    Person(s) Educated  Patient    Methods  Explanation;Verbal cues;Handout;Demonstration    Comprehension  Verbalized understanding;Verbal cues required;Returned demonstration       PT Short Term Goals - 03/26/17 1146      PT SHORT TERM GOAL #1   Title  pt to be I with inital HEP     Time  4    Period  Weeks     Status  New    Target Date  04/23/17      PT SHORT TERM GOAL #2   Title  pt to verbalize/ demo techniques to reduce pain and inflammation via RICE and HEP     Time  4    Period  Weeks    Status  New    Target Date  04/23/17      PT SHORT TERM GOAL #3   Title  pt to increase R shoulder active  flexion/ abduction to >/= 100 degrees to promote functional shoulder mobility     Time  4    Period  Weeks    Status  New    Target Date  04/23/17      PT SHORT TERM GOAL #4   Title  Increase Grip strength on R by >/= 10# to demo improvement in shoulder function         PT Long Term Goals - 03/26/17 1148      PT LONG TERM GOAL #1   Title  pt to increaes R shoulder flexion/ abduction to >/= 120 degrees and IR/ER to >/= 60 degrees assessed in 90/90 for functional mobility required for ADLs     Time  8    Period  Weeks    Status  New    Target Date  05/21/17      PT LONG TERM GOAL #2   Title  Increase R shoulder strength to >/= 4/5 to promote shoulder stability with activity.     Time  8    Period  Weeks    Status  New    Target Date  05/21/17      PT LONG TERM GOAL #3   Title  pt to be able to lift/ lower from overhead shelf >/= 10# and push/ pull >/= 15# to for  functional strength/ mobility     Time  8    Period  Weeks    Status  New    Target Date  05/21/17      PT LONG TERM GOAL #4   Title  Increase FOTO score to </= 38% limited to demo improvement in function    Time  8    Period  Weeks    Status  New    Target Date  05/21/17      PT LONG TERM GOAL #5   Title  pt to be I with all HEP given as of last visit to maintain and progress function     Time  8    Period  Weeks    Status  New    Target Date  05/21/17             Plan - 03/26/17 1143    Clinical Impression Statement  pt presents to OPPT s/p r RCR on 02/21/2017 due to catching a falling ladder back in July 2018. she demonstrates limited mobility in all planes actively and passively as expected  following surgery. Strength wasn't assessed in the RUE due to precautions. TTP noted only along the pec major/minor and around the  coracoid process. She would benefit from physical therapy to decrease R shoulder pain, impove mobility, increase strength and return to PLOF by addressing the deficits listed.     Clinical Presentation  Stable    Clinical Decision Making  Low    Rehab Potential  Good    PT Frequency  2x / week    PT Duration  8 weeks    PT Treatment/Interventions  ADLs/Self Care Home Management;Cryotherapy;Electrical Stimulation;Iontophoresis 4mg /ml Dexamethasone;Moist Heat;Ultrasound;Neuromuscular re-education;Patient/family education;Therapeutic activities;Therapeutic exercise;Manual techniques;Vasopneumatic Device;Taping;Passive range of motion    PT Next Visit Plan  review/ update HEP, shoulder mobs, scapular setting, PROM progressing to [facility], pt is 5 weeks out. modalities for pain PRN,     PT Home Exercise Plan  table slides flexion/ abduction, wand ER in supine, upper trap stretching, scapular retractions.     Consulted and Agree with Plan of Care  Patient       Patient will benefit from skilled therapeutic intervention in order to improve the following deficits and impairments:  Pain, Improper body mechanics, Postural dysfunction, Decreased strength, Decreased range of motion, Decreased endurance, Decreased activity tolerance, Increased edema  Visit Diagnosis: Chronic right shoulder pain  Muscle weakness (generalized)  Localized edema  Stiffness of right shoulder, not elsewhere classified     Problem List Patient Active Problem List   Diagnosis Date Noted  . Status post arthroscopy of right shoulder 03/04/2017  . Impingement syndrome of right shoulder 02/05/2017  . Partial tear of right rotator cuff 02/05/2017  . Chronic right shoulder pain 01/22/2017   Starr Lake PT, DPT, LAT, ATC  03/26/17  11:54 AM      [place]  [facility] [address] [place], [place], [address] Phone: [phone]   Fax:  [phone]  Name: Caitlin Price MRN: [id number] Date of Birth: 1950/05/23

## 2017-03-29 ENCOUNTER — Encounter: Payer: Self-pay | Admitting: Physical Therapy

## 2017-03-29 ENCOUNTER — Ambulatory Visit: Payer: BLUE CROSS/BLUE SHIELD | Admitting: Physical Therapy

## 2017-03-29 DIAGNOSIS — R600 Localized edema: Secondary | ICD-10-CM

## 2017-03-29 DIAGNOSIS — M6281 Muscle weakness (generalized): Secondary | ICD-10-CM

## 2017-03-29 DIAGNOSIS — M25611 Stiffness of right shoulder, not elsewhere classified: Secondary | ICD-10-CM

## 2017-03-29 DIAGNOSIS — M25511 Pain in right shoulder: Secondary | ICD-10-CM | POA: Diagnosis not present

## 2017-03-29 DIAGNOSIS — G8929 Other chronic pain: Secondary | ICD-10-CM

## 2017-03-29 NOTE — Therapy (Signed)
[place], [place], [address] Phone: [phone]   Fax:  [phone]  Physical Therapy Treatment  Patient Details  Name: Caitlin Price MRN: [id number] Date of Birth: Aug 08, 1950 Referring Provider: Zollie Beckers MD   Encounter Date: 03/29/2017  PT End of Session - 03/29/17 0854    Visit Number  2    Number of Visits  17    Date for PT Re-Evaluation  05/21/17    PT Start Time  0800    PT Stop Time  0845    PT Time Calculation (min)  45 min       Past Medical History:  Diagnosis Date  . Cancer ([place]) 2016   skin cancer- forehead   . Hyperlipidemia   . Hypertension   . Osteopenia     Past Surgical History:  Procedure Laterality Date  . EXCISIONAL HEMORRHOIDECTOMY    . ROTATOR CUFF REPAIR Right 02/21/2017    There were no vitals filed for this visit.  Subjective Assessment - 03/29/17 0853    Subjective  I tried the exercises, I have some questions.     Currently in Pain?  No/denies         [facility] PT Assessment - 03/29/17 0001      PROM   PROM Assessment Site  Shoulder    Right/Left Shoulder  Right    Right Shoulder Flexion  90 Degrees    Right Shoulder ABduction  90 Degrees    Right Shoulder Internal Rotation  45 Degrees    Right Shoulder External Rotation  20 Degrees                  [facility] Adult PT Treatment/Exercise - 03/29/17 0001      Shoulder Exercises: Supine   External Rotation  AAROM      Shoulder Exercises: Seated   Retraction  Strengthening;Both;10 reps holding 3 sec ea rap    Flexion  PROM table slide    Abduction  PROM table slides      Shoulder Exercises: Standing   Other Standing Exercises  pendulum      Shoulder Exercises: Stretch   Other Shoulder Stretches  R upper trap stretch 2 x 30 sec      Manual Therapy   Manual Therapy  Joint mobilization;Passive ROM    Joint Mobilization  gentle grade I, II A/P and inferiror glides Rt GHJ    Passive ROM   Flexion, abduction, ER, IR to tolerance                PT Short Term Goals - 03/26/17 1146      PT SHORT TERM GOAL #1   Title  pt to be I with inital HEP     Time  4    Period  Weeks    Status  New    Target Date  04/23/17      PT SHORT TERM GOAL #2   Title  pt to verbalize/ demo techniques to reduce pain and inflammation via RICE and HEP     Time  4    Period  Weeks    Status  New    Target Date  04/23/17      PT SHORT TERM GOAL #3   Title  pt to increase R shoulder active  flexion/ abduction to >/= 100 degrees to promote functional shoulder mobility     Time  4    Period  Weeks  Status  New    Target Date  04/23/17      PT SHORT TERM GOAL #4   Title  Increase Grip strength on R by >/= 10# to demo improvement in shoulder function         PT Long Term Goals - 03/26/17 1148      PT LONG TERM GOAL #1   Title  pt to increaes R shoulder flexion/ abduction to >/= 120 degrees and IR/ER to >/= 60 degrees assessed in 90/90 for functional mobility required for ADLs     Time  8    Period  Weeks    Status  New    Target Date  05/21/17      PT LONG TERM GOAL #2   Title  Increase R shoulder strength to >/= 4/5 to promote shoulder stability with activity.     Time  8    Period  Weeks    Status  New    Target Date  05/21/17      PT LONG TERM GOAL #3   Title  pt to be able to lift/ lower from overhead shelf >/= 10# and push/ pull >/= 15# to for  functional strength/ mobility     Time  8    Period  Weeks    Status  New    Target Date  05/21/17      PT LONG TERM GOAL #4   Title  Increase FOTO score to </= 38% limited to demo improvement in function    Time  8    Period  Weeks    Status  New    Target Date  05/21/17      PT LONG TERM GOAL #5   Title  pt to be I with all HEP given as of last visit to maintain and progress function     Time  8    Period  Weeks    Status  New    Target Date  05/21/17            Plan - 03/29/17 0854    Clinical  Impression Statement  Pt arrives reporting no pain. Began PROM. Flexion to 95, abduction to 90, ER to 20, IR to 45 with pain at end ranges. Reviewed HEP and cues to decrease upper trap activation with table slides. Also shown pendulum. She declined ice at end of session. I encouraged her to ice if needed at home.    PT Next Visit Plan  review/ update HEP, shoulder mobs, scapular setting, PROM progressing to [facility], pt is 5 weeks out. modalities for pain PRN, give pendulum handout    PT Home Exercise Plan  table slides flexion/ abduction, wand ER in supine, upper trap stretching, scapular retractions.        Patient will benefit from skilled therapeutic intervention in order to improve the following deficits and impairments:  Pain, Improper body mechanics, Postural dysfunction, Decreased strength, Decreased range of motion, Decreased endurance, Decreased activity tolerance, Increased edema  Visit Diagnosis: Chronic right shoulder pain  Muscle weakness (generalized)  Localized edema  Stiffness of right shoulder, not elsewhere classified     Problem List Patient Active Problem List   Diagnosis Date Noted  . Status post arthroscopy of right shoulder 03/04/2017  . Impingement syndrome of right shoulder 02/05/2017  . Partial tear of right rotator cuff 02/05/2017  . Chronic right shoulder pain 01/22/2017    Dorene Ar, PTA 03/29/2017, 9:09 AM  [facility] [address] [place], [place], [address] Phone: [phone]   Fax:  [phone]  Name: Caitlin Price MRN: [id number] Date of Birth: 1950-02-01

## 2017-04-02 ENCOUNTER — Encounter: Payer: Self-pay | Admitting: Physical Therapy

## 2017-04-02 ENCOUNTER — Ambulatory Visit: Payer: BLUE CROSS/BLUE SHIELD | Admitting: Physical Therapy

## 2017-04-02 DIAGNOSIS — M25511 Pain in right shoulder: Principal | ICD-10-CM

## 2017-04-02 DIAGNOSIS — R600 Localized edema: Secondary | ICD-10-CM

## 2017-04-02 DIAGNOSIS — G8929 Other chronic pain: Secondary | ICD-10-CM

## 2017-04-02 DIAGNOSIS — M6281 Muscle weakness (generalized): Secondary | ICD-10-CM

## 2017-04-02 DIAGNOSIS — M25611 Stiffness of right shoulder, not elsewhere classified: Secondary | ICD-10-CM

## 2017-04-02 NOTE — Therapy (Signed)
[place], [place], [address] Phone: [phone]   Fax:  [phone]  Physical Therapy Treatment  Patient Details  Name: Caitlin Price MRN: [id number] Date of Birth: March 27, 1950 Referring Provider: Zollie Beckers MD   Encounter Date: 04/02/2017  PT End of Session - 04/02/17 1500    Visit Number  3    Number of Visits  17    Date for PT Re-Evaluation  05/21/17    PT Start Time  0255    PT Stop Time  0340    PT Time Calculation (min)  45 min       Past Medical History:  Diagnosis Date  . Cancer ([place]) 2016   skin cancer- forehead   . Hyperlipidemia   . Hypertension   . Osteopenia     Past Surgical History:  Procedure Laterality Date  . EXCISIONAL HEMORRHOIDECTOMY    . ROTATOR CUFF REPAIR Right 02/21/2017    There were no vitals filed for this visit.  Subjective Assessment - 04/02/17 1459    Subjective  I have stinging and burning in upper arm and forearm.     Currently in Pain?  No/denies                No data recorded       [facility] Adult PT Treatment/Exercise - 04/02/17 0001      Shoulder Exercises: Supine   External Rotation  AAROM cane    Other Supine Exercises  suipne cane pulovers, pressups, also with clasped hands       Shoulder Exercises: Seated   Retraction  Strengthening;Both;10 reps holding 3 sec ea rap    External Rotation  PROM using table     Flexion  PROM table slide    Abduction  PROM table slides      Shoulder Exercises: Stretch   Other Shoulder Stretches  R upper trap stretch 2 x 30 sec      Manual Therapy   Joint Mobilization  gentle grade I, II A/P and inferiror glides Rt GHJ    Passive ROM  Flexion, abduction, ER, IR to tolerance              PT Education - 04/02/17 1545    Education provided  Yes    Education Details  HEP    Person(s) Educated  Patient    Methods  Explanation;Handout    Comprehension  Verbalized understanding        PT Short Term Goals - 03/26/17 1146      PT SHORT TERM GOAL #1   Title  pt to be I with inital HEP     Time  4    Period  Weeks    Status  New    Target Date  04/23/17      PT SHORT TERM GOAL #2   Title  pt to verbalize/ demo techniques to reduce pain and inflammation via RICE and HEP     Time  4    Period  Weeks    Status  New    Target Date  04/23/17      PT SHORT TERM GOAL #3   Title  pt to increase R shoulder active  flexion/ abduction to >/= 100 degrees to promote functional shoulder mobility     Time  4    Period  Weeks    Status  New    Target Date  04/23/17      PT SHORT  TERM GOAL #4   Title  Increase Grip strength on R by >/= 10# to demo improvement in shoulder function         PT Long Term Goals - 03/26/17 1148      PT LONG TERM GOAL #1   Title  pt to increaes R shoulder flexion/ abduction to >/= 120 degrees and IR/ER to >/= 60 degrees assessed in 90/90 for functional mobility required for ADLs     Time  8    Period  Weeks    Status  New    Target Date  05/21/17      PT LONG TERM GOAL #2   Title  Increase R shoulder strength to >/= 4/5 to promote shoulder stability with activity.     Time  8    Period  Weeks    Status  New    Target Date  05/21/17      PT LONG TERM GOAL #3   Title  pt to be able to lift/ lower from overhead shelf >/= 10# and push/ pull >/= 15# to for  functional strength/ mobility     Time  8    Period  Weeks    Status  New    Target Date  05/21/17      PT LONG TERM GOAL #4   Title  Increase FOTO score to </= 38% limited to demo improvement in function    Time  8    Period  Weeks    Status  New    Target Date  05/21/17      PT LONG TERM GOAL #5   Title  pt to be I with all HEP given as of last visit to maintain and progress function     Time  8    Period  Weeks    Status  New    Target Date  05/21/17            Plan - 04/02/17 1518    Clinical Impression Statement  Pt arrives reporting she has not performed any  HEP today. She demonstrates stiffness today and requires PROM to reach ROM she achieved last visit. Abduction improved from 60 degrees to 90 degrees during session. Flexion limited to 90 and ER to 20. She continued with difficulty performing HEP correctly. Began supine clasped hand flexion and pullovers as well as with cane, reviewed HEP. She is limited by pain with PROM. Began pulleys. Updated HEP. She reports no increased pain at end of session, declines modalities.     PT Duration  8 weeks    PT Next Visit Plan  review/ update HEP, shoulder mobs, scapular setting, PROM progressing to [facility], pt is 5 weeks out. modalities for pain PRN, give pendulum handout    PT Home Exercise Plan  table slides flexion/ abduction, wand ER in supine, upper trap stretching, scapular retractions. pendulums, supine cane press ups and pullovers, ER PROM using table     Consulted and Agree with Plan of Care  Patient       Patient will benefit from skilled therapeutic intervention in order to improve the following deficits and impairments:  Pain, Improper body mechanics, Postural dysfunction, Decreased strength, Decreased range of motion, Decreased endurance, Decreased activity tolerance, Increased edema  Visit Diagnosis: Chronic right shoulder pain  Muscle weakness (generalized)  Localized edema  Stiffness of right shoulder, not elsewhere classified     Problem List Patient Active Problem List   Diagnosis Date Noted  .  Status post arthroscopy of right shoulder 03/04/2017  . Impingement syndrome of right shoulder 02/05/2017  . Partial tear of right rotator cuff 02/05/2017  . Chronic right shoulder pain 01/22/2017    Dorene Ar, PTA 04/02/2017, 3:49 PM  [facility] [address] [place], [place], [address] Phone: [phone]   Fax:  [phone]  Name: Caitlin Price MRN: [id number] Date of Birth: 01-Aug-1950

## 2017-04-03 ENCOUNTER — Encounter (INDEPENDENT_AMBULATORY_CARE_PROVIDER_SITE_OTHER): Payer: Self-pay | Admitting: Orthopaedic Surgery

## 2017-04-03 ENCOUNTER — Ambulatory Visit (INDEPENDENT_AMBULATORY_CARE_PROVIDER_SITE_OTHER): Payer: BLUE CROSS/BLUE SHIELD | Admitting: Orthopaedic Surgery

## 2017-04-03 DIAGNOSIS — Z9889 Other specified postprocedural states: Secondary | ICD-10-CM

## 2017-04-03 NOTE — Progress Notes (Signed)
HPI: Ms. Janae Bridgeman returns today follow-up of her right shoulder status post rotator cuff repair on February 14.  She states overall she is doing good she is been to physical therapy 2 sessions.  She still is having some pain in the arm and some stinging and burning but this is getting better.  She denies any neck pain.  Pain does not typically go past the elbow.  Physical exam right shoulder passively I can bring her forward flexion to approximately 85 degrees and about 80 degrees of abduction.  She has minimal external rotation.  Good range of motion of the right elbow full supination pronation of the forearm hand sensation grossly intact throughout.  Impression: 6 weeks status post right shoulder arthroscopy with arthroscopic debridemen, subacromial decompression t and arthroscopic rotator cuff repair  Plan: At this point time recommend that she discontinue the sling.  She will continue work with therapy to regain gain her range of motion and strength in the shoulder.  She will follow with [place] in 4 weeks check her progress or lack of.  Questions encouraged and answered by Dr. Ninfa Linden and  myself.

## 2017-04-04 ENCOUNTER — Ambulatory Visit: Payer: BLUE CROSS/BLUE SHIELD | Admitting: Physical Therapy

## 2017-04-04 ENCOUNTER — Encounter: Payer: Self-pay | Admitting: Physical Therapy

## 2017-04-04 DIAGNOSIS — R600 Localized edema: Secondary | ICD-10-CM

## 2017-04-04 DIAGNOSIS — M25511 Pain in right shoulder: Principal | ICD-10-CM

## 2017-04-04 DIAGNOSIS — M6281 Muscle weakness (generalized): Secondary | ICD-10-CM

## 2017-04-04 DIAGNOSIS — G8929 Other chronic pain: Secondary | ICD-10-CM

## 2017-04-04 DIAGNOSIS — M25611 Stiffness of right shoulder, not elsewhere classified: Secondary | ICD-10-CM

## 2017-04-04 NOTE — Therapy (Signed)
[place], [place], [address] Phone: [phone]   Fax:  [phone]  Physical Therapy Treatment  Patient Details  Name: Caitlin Price MRN: [id number] Date of Birth: 1950/11/14 Referring Provider: Zollie Beckers MD   Encounter Date: 04/04/2017  PT End of Session - 04/04/17 1653    Visit Number  4    Number of Visits  17    Date for PT Re-Evaluation  05/21/17    PT Start Time  1631    PT Stop Time  1720    PT Time Calculation (min)  49 min    Activity Tolerance  Patient tolerated treatment well    Behavior During Therapy  [facility] for tasks assessed/performed       Past Medical History:  Diagnosis Date  . Cancer ([place]) 2016   skin cancer- forehead   . Hyperlipidemia   . Hypertension   . Osteopenia     Past Surgical History:  Procedure Laterality Date  . EXCISIONAL HEMORRHOIDECTOMY    . ROTATOR CUFF REPAIR Right 02/21/2017    There were no vitals filed for this visit.  Subjective Assessment - 04/04/17 1631    Subjective  "Saw the MD and they release me from the sling but continue with motion"    Patient Stated Goals  get back to normal, using the R arm     Currently in Pain?  Yes    Pain Score  0-No pain last took tylenol at 2:30    Pain Orientation  Right    Pain Onset  More than a month ago    Pain Frequency  Intermittent    Aggravating Factors   any shoulder movement or activity    Pain Relieving Factors  sitting/ resting, doing exericses.                 No data recorded       [facility] Adult PT Treatment/Exercise - 04/04/17 1649      Elbow Exercises   Elbow Flexion  Strengthening;Bar weights/barbell;10 reps;Right 2 sets with 2#      Shoulder Exercises: Pulleys   Flexion  3 minutes    Scaption  3 minutes      Shoulder Exercises: Isometric Strengthening   Flexion  5X10"    Extension  5X10"    External Rotation  5X10"    Internal Rotation  5X10"      Shoulder  Exercises: Stretch   Other Shoulder Stretches  R upper trap stretch 2 x 30 sec      Modalities   Modalities  Cryotherapy      Cryotherapy   Number Minutes Cryotherapy  10 Minutes    Cryotherapy Location  Shoulder    Type of Cryotherapy  Ice pack      Manual Therapy   Manual Therapy  Scapular mobilization    Joint Mobilization  gentle grade I, II A/P and inferiror glides Rt GHJ    Scapular Mobilization  upward scapular rotation     Passive ROM  Flexion, abduction, ER, IR to tolerance with gentle oscillations for pain             PT Education - 04/04/17 1653    Education provided  Yes    Education Details  updated HEP for isometrics    Person(s) Educated  Patient    Methods  Explanation;Handout;Verbal cues;Demonstration    Comprehension  Verbalized understanding;Verbal cues required;Returned demonstration       PT Short Term  Goals - 03/26/17 1146      PT SHORT TERM GOAL #1   Title  pt to be I with inital HEP     Time  4    Period  Weeks    Status  New    Target Date  04/23/17      PT SHORT TERM GOAL #2   Title  pt to verbalize/ demo techniques to reduce pain and inflammation via RICE and HEP     Time  4    Period  Weeks    Status  New    Target Date  04/23/17      PT SHORT TERM GOAL #3   Title  pt to increase R shoulder active  flexion/ abduction to >/= 100 degrees to promote functional shoulder mobility     Time  4    Period  Weeks    Status  New    Target Date  04/23/17      PT SHORT TERM GOAL #4   Title  Increase Grip strength on R by >/= 10# to demo improvement in shoulder function         PT Long Term Goals - 03/26/17 1148      PT LONG TERM GOAL #1   Title  pt to increaes R shoulder flexion/ abduction to >/= 120 degrees and IR/ER to >/= 60 degrees assessed in 90/90 for functional mobility required for ADLs     Time  8    Period  Weeks    Status  New    Target Date  05/21/17      PT LONG TERM GOAL #2   Title  Increase R shoulder strength to  >/= 4/5 to promote shoulder stability with activity.     Time  8    Period  Weeks    Status  New    Target Date  05/21/17      PT LONG TERM GOAL #3   Title  pt to be able to lift/ lower from overhead shelf >/= 10# and push/ pull >/= 15# to for  functional strength/ mobility     Time  8    Period  Weeks    Status  New    Target Date  05/21/17      PT LONG TERM GOAL #4   Title  Increase FOTO score to </= 38% limited to demo improvement in function    Time  8    Period  Weeks    Status  New    Target Date  05/21/17      PT LONG TERM GOAL #5   Title  pt to be I with all HEP given as of last visit to maintain and progress function     Time  8    Period  Weeks    Status  New    Target Date  05/21/17            Plan - 04/04/17 1656    Clinical Impression Statement  pt reported the MD was pleased from her progress and released her from using the sling. continued working on shoulder mobility via mobs and PROM. Updated HEP for isometrics to promote shoulder strength. utilized ice end of session for soreness.     PT Next Visit Plan  update HEP, shoulder mobs, scapular setting, PROM progressing to [facility], pt is 6 weeks out. modalities for pain PRN, progress per protocol.     PT Home Exercise Plan  table  slides flexion/ abduction, wand ER in supine, upper trap stretching, scapular retractions. pendulums, supine cane press ups and pullovers, ER PROM using table     Consulted and Agree with Plan of Care  Patient       Patient will benefit from skilled therapeutic intervention in order to improve the following deficits and impairments:  Pain, Improper body mechanics, Postural dysfunction, Decreased strength, Decreased range of motion, Decreased endurance, Decreased activity tolerance, Increased edema  Visit Diagnosis: Chronic right shoulder pain  Muscle weakness (generalized)  Localized edema  Stiffness of right shoulder, not elsewhere classified     Problem List Patient  Active Problem List   Diagnosis Date Noted  . Status post arthroscopy of right shoulder 03/04/2017  . Impingement syndrome of right shoulder 02/05/2017  . Partial tear of right rotator cuff 02/05/2017  . Chronic right shoulder pain 01/22/2017   Starr Lake PT, DPT, LAT, ATC  04/04/17  5:30 PM      [facility] [address] [place], [place], [address] Phone: [phone]   Fax:  [phone]  Name: BLINDA TUREK MRN: [id number] Date of Birth: [birth year]

## 2017-04-09 ENCOUNTER — Ambulatory Visit: Payer: BLUE CROSS/BLUE SHIELD | Attending: Orthopaedic Surgery | Admitting: Physical Therapy

## 2017-04-09 ENCOUNTER — Encounter: Payer: Self-pay | Admitting: Physical Therapy

## 2017-04-09 DIAGNOSIS — R600 Localized edema: Secondary | ICD-10-CM

## 2017-04-09 DIAGNOSIS — M25511 Pain in right shoulder: Secondary | ICD-10-CM | POA: Insufficient documentation

## 2017-04-09 DIAGNOSIS — M6281 Muscle weakness (generalized): Secondary | ICD-10-CM

## 2017-04-09 DIAGNOSIS — G8929 Other chronic pain: Secondary | ICD-10-CM

## 2017-04-09 DIAGNOSIS — M25611 Stiffness of right shoulder, not elsewhere classified: Secondary | ICD-10-CM | POA: Diagnosis present

## 2017-04-09 NOTE — Therapy (Signed)
[place], [place], [address] Phone: [phone]   Fax:  [phone]  Physical Therapy Treatment  Patient Details  Name: Caitlin Price MRN: [id number] Date of Birth: [birth year] Referring Provider: Zollie Beckers MD   Encounter Date: 04/09/2017  PT End of Session - 04/09/17 0930    Visit Number  5    Number of Visits  17    Date for PT Re-Evaluation  05/21/17    PT Start Time  0931    PT Stop Time  1014    PT Time Calculation (min)  43 min    Activity Tolerance  Patient tolerated treatment well    Behavior During Therapy  [facility] for tasks assessed/performed       Past Medical History:  Diagnosis Date  . Cancer ([place]) 2016   skin cancer- forehead   . Hyperlipidemia   . Hypertension   . Osteopenia     Past Surgical History:  Procedure Laterality Date  . EXCISIONAL HEMORRHOIDECTOMY    . ROTATOR CUFF REPAIR Right 02/21/2017    There were no vitals filed for this visit.  Subjective Assessment - 04/09/17 0930    Subjective  "I still have soreness in the R shoulder and tightness in the front. I have been doing my new exercises but think I have been pushing pretty hard"     Currently in Pain?  Yes    Pain Score  4     Pain Orientation  Right    Pain Descriptors / Indicators  Sore    Pain Type  Surgical pain                       [facility] Adult PT Treatment/Exercise - 04/09/17 0936      Shoulder Exercises: Supine   Other Supine Exercises  suipne cane pullovers, pressups 2 x 10       Shoulder Exercises: ROM/Strengthening   UBE (Upper Arm Bike)  L1 x 5 min  changing direction at 2:30 sec    Other ROM/Strengthening Exercises  wand ER 2 x 10 working into end ranges verbal cues for proper form      Shoulder Exercises: Isometric Strengthening   Flexion  5X10"    Extension  5X10"    External Rotation  5X10"    Internal Rotation  5X10"      Shoulder Exercises: Stretch   Other  Shoulder Stretches  R upper trap stretch 2 x 30 sec      Manual Therapy   Joint Mobilization   grade II  and III A/P and inferiror glides Rt GHJ    Scapular Mobilization  upward scapular rotation     Passive ROM  Flexion, abduction, ER, IR to tolerance with gentle oscillations working into end ranges             PT Education - 04/09/17 1000    Education provided  Yes    Education Details  reviewed / updated HEP    Person(s) Educated  Patient    Methods  Explanation;Verbal cues    Comprehension  Verbalized understanding;Verbal cues required       PT Short Term Goals - 03/26/17 1146      PT SHORT TERM GOAL #1   Title  pt to be I with inital HEP     Time  4    Period  Weeks    Status  New    Target Date  04/23/17  PT SHORT TERM GOAL #2   Title  pt to verbalize/ demo techniques to reduce pain and inflammation via RICE and HEP     Time  4    Period  Weeks    Status  New    Target Date  04/23/17      PT SHORT TERM GOAL #3   Title  pt to increase R shoulder active  flexion/ abduction to >/= 100 degrees to promote functional shoulder mobility     Time  4    Period  Weeks    Status  New    Target Date  04/23/17      PT SHORT TERM GOAL #4   Title  Increase Grip strength on R by >/= 10# to demo improvement in shoulder function         PT Long Term Goals - 03/26/17 1148      PT LONG TERM GOAL #1   Title  pt to increaes R shoulder flexion/ abduction to >/= 120 degrees and IR/ER to >/= 60 degrees assessed in 90/90 for functional mobility required for ADLs     Time  8    Period  Weeks    Status  New    Target Date  05/21/17      PT LONG TERM GOAL #2   Title  Increase R shoulder strength to >/= 4/5 to promote shoulder stability with activity.     Time  8    Period  Weeks    Status  New    Target Date  05/21/17      PT LONG TERM GOAL #3   Title  pt to be able to lift/ lower from overhead shelf >/= 10# and push/ pull >/= 15# to for  functional strength/  mobility     Time  8    Period  Weeks    Status  New    Target Date  05/21/17      PT LONG TERM GOAL #4   Title  Increase FOTO score to </= 38% limited to demo improvement in function    Time  8    Period  Weeks    Status  New    Target Date  05/21/17      PT LONG TERM GOAL #5   Title  pt to be I with all HEP given as of last visit to maintain and progress function     Time  8    Period  Weeks    Status  New    Target Date  05/21/17            Plan - 04/09/17 1015    Clinical Impression Statement  pt continues to report some tightness and soreness along the pec minor. following MTPR techniques and stretching she reported reduced pain and tightness. continued ROM activites with mobs and AAROM techniques in supine. progress to UBE per protocol which she performed well. end of session she reported no pain and declined modalities.     PT Treatment/Interventions  ADLs/Self Care Home Management;Cryotherapy;Electrical Stimulation;Iontophoresis 4mg /ml Dexamethasone;Moist Heat;Ultrasound;Neuromuscular re-education;Patient/family education;Therapeutic activities;Therapeutic exercise;Manual techniques;Vasopneumatic Device;Taping;Passive range of motion    PT Next Visit Plan  update HEP, shoulder mobs, scapular setting, PROM progressing to [facility], pt is 6 weeks out. modalities for pain PRN, progress per protocol. progress AAROM, if doing well trial isotonics    PT Home Exercise Plan  table slides flexion/ abduction, wand ER in supine, upper trap stretching, scapular retractions. pendulums, supine cane  press ups and pullovers, ER PROM using table     Consulted and Agree with Plan of Care  Patient       Patient will benefit from skilled therapeutic intervention in order to improve the following deficits and impairments:  Pain, Improper body mechanics, Postural dysfunction, Decreased strength, Decreased range of motion, Decreased endurance, Decreased activity tolerance, Increased edema  Visit  Diagnosis: Chronic right shoulder pain  Muscle weakness (generalized)  Stiffness of right shoulder, not elsewhere classified  Localized edema     Problem List Patient Active Problem List   Diagnosis Date Noted  . Status post arthroscopy of right shoulder 03/04/2017  . Impingement syndrome of right shoulder 02/05/2017  . Partial tear of right rotator cuff 02/05/2017  . Chronic right shoulder pain 01/22/2017   Starr Lake PT, DPT, LAT, ATC  04/09/17  10:18 AM          [facility] [address] [place], [place], [address] Phone: [phone]   Fax:  [phone]  Name: Caitlin Price MRN: [id number] Date of Birth: Nov 24, 1950

## 2017-04-11 ENCOUNTER — Encounter: Payer: Self-pay | Admitting: Physical Therapy

## 2017-04-11 ENCOUNTER — Ambulatory Visit: Payer: BLUE CROSS/BLUE SHIELD | Admitting: Physical Therapy

## 2017-04-11 DIAGNOSIS — G8929 Other chronic pain: Secondary | ICD-10-CM

## 2017-04-11 DIAGNOSIS — M25611 Stiffness of right shoulder, not elsewhere classified: Secondary | ICD-10-CM

## 2017-04-11 DIAGNOSIS — M6281 Muscle weakness (generalized): Secondary | ICD-10-CM

## 2017-04-11 DIAGNOSIS — R600 Localized edema: Secondary | ICD-10-CM

## 2017-04-11 DIAGNOSIS — M25511 Pain in right shoulder: Principal | ICD-10-CM

## 2017-04-11 NOTE — Therapy (Signed)
[place], [place], [address] Phone: [phone]   Fax:  [phone]  Physical Therapy Treatment  Patient Details  Name: Caitlin Price MRN: [id number] Date of Birth: Feb 02, 1950 Referring Provider: Zollie Beckers MD   Encounter Date: 04/11/2017  PT End of Session - 04/11/17 0942    Visit Number  6    Number of Visits  17    Date for PT Re-Evaluation  05/21/17    PT Start Time  0930    PT Stop Time  1015    PT Time Calculation (min)  45 min       Past Medical History:  Diagnosis Date  . Cancer ([place]) 2016   skin cancer- forehead   . Hyperlipidemia   . Hypertension   . Osteopenia     Past Surgical History:  Procedure Laterality Date  . EXCISIONAL HEMORRHOIDECTOMY    . ROTATOR CUFF REPAIR Right 02/21/2017    There were no vitals filed for this visit.  Subjective Assessment - 04/11/17 0936    Subjective  Shoulder is just irritated.     Currently in Pain?  Yes    Pain Score  2     Pain Location  Shoulder    Pain Orientation  Right    Pain Descriptors / Indicators  -- irritated    Aggravating Factors   first wake up     Pain Relieving Factors  resting, exercises         [facility] PT Assessment - 04/11/17 0001      PROM   Right Shoulder Flexion  132 Degrees    Right Shoulder ABduction  110 Degrees    Right Shoulder External Rotation  25 Degrees                   [facility] Adult PT Treatment/Exercise - 04/11/17 0001      Shoulder Exercises: Supine   Other Supine Exercises  suipne cane pullovers, pressups 2 x 10       Shoulder Exercises: Standing   Retraction  10 reps    Other Standing Exercises  wall slides x 10       Shoulder Exercises: Pulleys   Flexion  3 minutes      Shoulder Exercises: ROM/Strengthening   UBE (Upper Arm Bike)  L1 x 5 min  changing direction at 2:30 sec    Other ROM/Strengthening Exercises  wand ER 2 x 10 working into end ranges verbal cues for proper  form      Shoulder Exercises: Isometric Strengthening   Flexion  5X10"    Extension  5X10"    External Rotation  5X10"    Internal Rotation  5X10"      Shoulder Exercises: Stretch   Other Shoulder Stretches  R upper trap stretch 2 x 30 sec      Manual Therapy   Joint Mobilization   grade II  and III A/P and inferiror glides Rt GHJ    Passive ROM  Flexion, abduction, ER, IR to tolerance with gentle oscillations working into end ranges             PT Education - 04/11/17 1014    Education provided  Yes    Education Details  HEP    Person(s) Educated  Patient    Methods  Explanation;Handout    Comprehension  Verbalized understanding       PT Short Term Goals - 03/26/17 1146  PT SHORT TERM GOAL #1   Title  pt to be I with inital HEP     Time  4    Period  Weeks    Status  New    Target Date  04/23/17      PT SHORT TERM GOAL #2   Title  pt to verbalize/ demo techniques to reduce pain and inflammation via RICE and HEP     Time  4    Period  Weeks    Status  New    Target Date  04/23/17      PT SHORT TERM GOAL #3   Title  pt to increase R shoulder active  flexion/ abduction to >/= 100 degrees to promote functional shoulder mobility     Time  4    Period  Weeks    Status  New    Target Date  04/23/17      PT SHORT TERM GOAL #4   Title  Increase Grip strength on R by >/= 10# to demo improvement in shoulder function         PT Long Term Goals - 03/26/17 1148      PT LONG TERM GOAL #1   Title  pt to increaes R shoulder flexion/ abduction to >/= 120 degrees and IR/ER to >/= 60 degrees assessed in 90/90 for functional mobility required for ADLs     Time  8    Period  Weeks    Status  New    Target Date  05/21/17      PT LONG TERM GOAL #2   Title  Increase R shoulder strength to >/= 4/5 to promote shoulder stability with activity.     Time  8    Period  Weeks    Status  New    Target Date  05/21/17      PT LONG TERM GOAL #3   Title  pt to be able to  lift/ lower from overhead shelf >/= 10# and push/ pull >/= 15# to for  functional strength/ mobility     Time  8    Period  Weeks    Status  New    Target Date  05/21/17      PT LONG TERM GOAL #4   Title  Increase FOTO score to </= 38% limited to demo improvement in function    Time  8    Period  Weeks    Status  New    Target Date  05/21/17      PT LONG TERM GOAL #5   Title  pt to be I with all HEP given as of last visit to maintain and progress function     Time  8    Period  Weeks    Status  New    Target Date  05/21/17            Plan - 04/11/17 1001    Clinical Impression Statement  Began wall slides with good tolerance. Pt demonstrates improved AAROM/PROM. Progressing toward STGs.     PT Next Visit Plan  update HEP, shoulder mobs, scapular setting, PROM progressing to [facility], pt is 6 weeks out. modalities for pain PRN, progress per protocol. progress AAROM, if doing well trial isotonics    PT Home Exercise Plan  table slides flexion/ abduction, wand ER in supine, upper trap stretching, scapular retractions. pendulums, supine cane press ups and pullovers, ER PROM using table , wall slides    Consulted  and Agree with Plan of Care  Patient       Patient will benefit from skilled therapeutic intervention in order to improve the following deficits and impairments:  Pain, Improper body mechanics, Postural dysfunction, Decreased strength, Decreased range of motion, Decreased endurance, Decreased activity tolerance, Increased edema  Visit Diagnosis: Chronic right shoulder pain  Muscle weakness (generalized)  Localized edema  Stiffness of right shoulder, not elsewhere classified     Problem List Patient Active Problem List   Diagnosis Date Noted  . Status post arthroscopy of right shoulder 03/04/2017  . Impingement syndrome of right shoulder 02/05/2017  . Partial tear of right rotator cuff 02/05/2017  . Chronic right shoulder pain 01/22/2017    Dorene Ar , PTA 04/11/2017, 10:17 AM  [facility] [address] [place], [place], [address] Phone: [phone]   Fax:  [phone]  Name: AME HEAGLE MRN: [id number] Date of Birth: [birth year]

## 2017-04-16 ENCOUNTER — Encounter: Payer: Self-pay | Admitting: Physical Therapy

## 2017-04-16 ENCOUNTER — Ambulatory Visit: Payer: BLUE CROSS/BLUE SHIELD | Admitting: Physical Therapy

## 2017-04-16 DIAGNOSIS — M6281 Muscle weakness (generalized): Secondary | ICD-10-CM

## 2017-04-16 DIAGNOSIS — M25611 Stiffness of right shoulder, not elsewhere classified: Secondary | ICD-10-CM

## 2017-04-16 DIAGNOSIS — G8929 Other chronic pain: Secondary | ICD-10-CM

## 2017-04-16 DIAGNOSIS — M25511 Pain in right shoulder: Secondary | ICD-10-CM | POA: Diagnosis not present

## 2017-04-16 DIAGNOSIS — R600 Localized edema: Secondary | ICD-10-CM

## 2017-04-16 NOTE — Therapy (Addendum)
[place], [place], [address] Phone: [phone]   Fax:  [phone]  Physical Therapy Treatment  Patient Details  Name: Caitlin Price MRN: [id number] Date of Birth: 11/25/1950 Referring Provider: Zollie Beckers MD   Encounter Date: 04/16/2017  PT End of Session - 04/16/17 0852    Visit Number  7    Number of Visits  17    Date for PT Re-Evaluation  05/21/17    PT Start Time  0846    PT Stop Time  0930    PT Time Calculation (min)  44 min       Past Medical History:  Diagnosis Date  . Cancer ([place]) 2016   skin cancer- forehead   . Hyperlipidemia   . Hypertension   . Osteopenia     Past Surgical History:  Procedure Laterality Date  . EXCISIONAL HEMORRHOIDECTOMY    . ROTATOR CUFF REPAIR Right 02/21/2017    There were no vitals filed for this visit.  Subjective Assessment - 04/16/17 0851    Subjective  I had a big pain with stretching my shoulder. Is there another stretch I can do?     Currently in Pain?  Yes    Pain Score  3     Pain Location  Shoulder    Pain Orientation  Right    Pain Descriptors / Indicators  Sore    Pain Type  Surgical pain    Aggravating Factors   over stretching    Pain Relieving Factors  resting, exercise         [facility] PT Assessment - 04/16/17 0001      AROM   Right Shoulder Flexion  120 Degrees    Right Shoulder ABduction  110 Degrees    Right Shoulder Internal Rotation  -- reach to L5    Right Shoulder External Rotation  -- reach to base of occiput      PROM   Right Shoulder Flexion  150 Degrees    Right Shoulder ABduction  120 Degrees    Right Shoulder Internal Rotation  45 Degrees    Right Shoulder External Rotation  40 Degrees                   [facility] Adult PT Treatment/Exercise - 04/16/17 0001      Shoulder Exercises: Supine   Flexion  AROM;20 reps flexed to exteneded    Diagonals  AROM;10 reps      Shoulder Exercises: Prone    Retraction  10 reps    Extension  10 reps      Shoulder Exercises: Sidelying   External Rotation  AROM;10 reps    ABduction  AROM;10 reps      Shoulder Exercises: Standing   Retraction  20 reps red    Other Standing Exercises  wall slides x 20  cues for scapula position      Shoulder Exercises: Pulleys   Flexion  3 minutes    Scaption  3 minutes      Shoulder Exercises: ROM/Strengthening   UBE (Upper Arm Bike)  L1 x 6 min  changing direction at 3 min      Shoulder Exercises: Stretch   External Rotation Stretch  3 reps;30 seconds single arm in doorway      Manual Therapy   Joint Mobilization   grade II  and III A/P and inferiror glides Rt GHJ    Passive ROM  Flexion, abduction, ER, IR to  tolerance with gentle oscillations working into end ranges             PT Education - 04/16/17 1008    Education provided  Yes    Education Details  HEP    Person(s) Educated  Patient    Methods  Explanation;Handout    Comprehension  Verbalized understanding       PT Short Term Goals - 03/26/17 1146      PT SHORT TERM GOAL #1   Title  pt to be I with inital HEP     Time  4    Period  Weeks    Status  New    Target Date  04/23/17      PT SHORT TERM GOAL #2   Title  pt to verbalize/ demo techniques to reduce pain and inflammation via RICE and HEP     Time  4    Period  Weeks    Status  New    Target Date  04/23/17      PT SHORT TERM GOAL #3   Title  pt to increase R shoulder active  flexion/ abduction to >/= 100 degrees to promote functional shoulder mobility     Time  4    Period  Weeks    Status  New    Target Date  04/23/17      PT SHORT TERM GOAL #4   Title  Increase Grip strength on R by >/= 10# to demo improvement in shoulder function         PT Long Term Goals - 03/26/17 1148      PT LONG TERM GOAL #1   Title  pt to increaes R shoulder flexion/ abduction to >/= 120 degrees and IR/ER to >/= 60 degrees assessed in 90/90 for functional mobility required for  ADLs     Time  8    Period  Weeks    Status  New    Target Date  05/21/17      PT LONG TERM GOAL #2   Title  Increase R shoulder strength to >/= 4/5 to promote shoulder stability with activity.     Time  8    Period  Weeks    Status  New    Target Date  05/21/17      PT LONG TERM GOAL #3   Title  pt to be able to lift/ lower from overhead shelf >/= 10# and push/ pull >/= 15# to for  functional strength/ mobility     Time  8    Period  Weeks    Status  New    Target Date  05/21/17      PT LONG TERM GOAL #4   Title  Increase FOTO score to </= 38% limited to demo improvement in function    Time  8    Period  Weeks    Status  New    Target Date  05/21/17      PT LONG TERM GOAL #5   Title  pt to be I with all HEP given as of last visit to maintain and progress function     Time  8    Period  Weeks    Status  New    Target Date  05/21/17            Plan - 04/16/17 0933    Clinical Impression Statement  Reviewed wall slide with cues not to raise scapula. She demonstrates improved AROM  and PROM. She was instructed in ER stretch at doorway per her request for a new ER stretch. She has difficulty using the cane. Updated with HEP. Began AROM exercises without increased pain.     PT Next Visit Plan  update HEP, shoulder mobs, scapular setting, PROM progressing to [facility], pt is 6 weeks out. modalities for pain PRN, progress per protocol. progress AAROM, if doing well trial isotonics    PT Home Exercise Plan  table slides flexion/ abduction, wand ER in supine, upper trap stretching, scapular retractions. pendulums, supine cane press ups and pullovers, ER PROM using table , wall slides, ER single arm doorway stretch    Consulted and Agree with Plan of Care  Patient       Patient will benefit from skilled therapeutic intervention in order to improve the following deficits and impairments:  Pain, Improper body mechanics, Postural dysfunction, Decreased strength, Decreased range of  motion, Decreased endurance, Decreased activity tolerance, Increased edema  Visit Diagnosis: Chronic right shoulder pain  Muscle weakness (generalized)  Localized edema  Stiffness of right shoulder, not elsewhere classified     Problem List Patient Active Problem List   Diagnosis Date Noted  . Status post arthroscopy of right shoulder 03/04/2017  . Impingement syndrome of right shoulder 02/05/2017  . Partial tear of right rotator cuff 02/05/2017  . Chronic right shoulder pain 01/22/2017    Dorene Ar, PTA 04/16/2017, 10:08 AM  [place], [place], [address] Phone: [phone]   Fax:  [phone]  Name: Caitlin Price MRN: [id number] Date of Birth: [birth year]

## 2017-04-18 ENCOUNTER — Ambulatory Visit: Payer: BLUE CROSS/BLUE SHIELD | Admitting: Physical Therapy

## 2017-04-18 ENCOUNTER — Encounter: Payer: Self-pay | Admitting: Physical Therapy

## 2017-04-18 DIAGNOSIS — R600 Localized edema: Secondary | ICD-10-CM

## 2017-04-18 DIAGNOSIS — M25511 Pain in right shoulder: Secondary | ICD-10-CM | POA: Diagnosis not present

## 2017-04-18 DIAGNOSIS — G8929 Other chronic pain: Secondary | ICD-10-CM

## 2017-04-18 DIAGNOSIS — M25611 Stiffness of right shoulder, not elsewhere classified: Secondary | ICD-10-CM

## 2017-04-18 DIAGNOSIS — M6281 Muscle weakness (generalized): Secondary | ICD-10-CM

## 2017-04-18 NOTE — Therapy (Signed)
[place], [place], [address] Phone: [phone]   Fax:  [phone]  Physical Therapy Treatment  Patient Details  Name: Caitlin Price MRN: [id number] Date of Birth: 17-Feb-1950 Referring Provider: Zollie Beckers MD   Encounter Date: 04/18/2017  PT End of Session - 04/18/17 0928    Visit Number  8    Number of Visits  17    Date for PT Re-Evaluation  05/21/17    PT Start Time  0930    PT Stop Time  1607    PT Time Calculation (min)  44 min    Activity Tolerance  Patient tolerated treatment well    Behavior During Therapy  [facility] for tasks assessed/performed       Past Medical History:  Diagnosis Date  . Cancer ([place]) 2016   skin cancer- forehead   . Hyperlipidemia   . Hypertension   . Osteopenia     Past Surgical History:  Procedure Laterality Date  . EXCISIONAL HEMORRHOIDECTOMY    . ROTATOR CUFF REPAIR Right 02/21/2017    There were no vitals filed for this visit.  Subjective Assessment - 04/18/17 0927    Subjective  "I am feeling sore in the back of by arm above my elbow today"     Pain Score  3     Pain Location  Shoulder    Pain Orientation  Right         [facility] PT Assessment - 04/18/17 1007      Observation/Other Assessments   Focus on Therapeutic Outcomes (FOTO)   30% limited                   [facility] Adult PT Treatment/Exercise - 04/18/17 0928      Shoulder Exercises: Supine   Protraction  10 reps;Strengthening;Both 2#      Shoulder Exercises: Standing   External Rotation  Strengthening;Right;10 reps;Theraband    Theraband Level (Shoulder External Rotation)  Level 1 (Yellow)    Internal Rotation  Strengthening;10 reps;Theraband    Theraband Level (Shoulder Internal Rotation)  Level 1 (Yellow)    Extension  10 reps;Strengthening;Both;Theraband    Theraband Level (Shoulder Extension)  Level 2 (Red)    Row  Strengthening;10 reps;Theraband    Theraband Level  (Shoulder Row)  Level 2 (Red)    Other Standing Exercises  wall slides x 20       Shoulder Exercises: Pulleys   Flexion  3 minutes      Shoulder Exercises: ROM/Strengthening   UBE (Upper Arm Bike)  L2 x 6 min  changing direction at 3 min    Other ROM/Strengthening Exercises  wall ladder 5 x flexion/ abduction with eccentric lowering      Shoulder Exercises: Stretch   Other Shoulder Stretches  R upper trap stretch 2 x 30 sec             PT Education - 04/18/17 0945    Education provided  Yes    Education Details  updated HEP for shoulder isotonics    Person(s) Educated  Patient    Methods  Explanation;Verbal cues    Comprehension  Verbalized understanding;Verbal cues required       PT Short Term Goals - 03/26/17 1146      PT SHORT TERM GOAL #1   Title  pt to be I with inital HEP     Time  4    Period  Weeks    Status  New  Target Date  04/23/17      PT SHORT TERM GOAL #2   Title  pt to verbalize/ demo techniques to reduce pain and inflammation via RICE and HEP     Time  4    Period  Weeks    Status  New    Target Date  04/23/17      PT SHORT TERM GOAL #3   Title  pt to increase R shoulder active  flexion/ abduction to >/= 100 degrees to promote functional shoulder mobility     Time  4    Period  Weeks    Status  New    Target Date  04/23/17      PT SHORT TERM GOAL #4   Title  Increase Grip strength on R by >/= 10# to demo improvement in shoulder function         PT Long Term Goals - 03/26/17 1148      PT LONG TERM GOAL #1   Title  pt to increaes R shoulder flexion/ abduction to >/= 120 degrees and IR/ER to >/= 60 degrees assessed in 90/90 for functional mobility required for ADLs     Time  8    Period  Weeks    Status  New    Target Date  05/21/17      PT LONG TERM GOAL #2   Title  Increase R shoulder strength to >/= 4/5 to promote shoulder stability with activity.     Time  8    Period  Weeks    Status  New    Target Date  05/21/17      PT  LONG TERM GOAL #3   Title  pt to be able to lift/ lower from overhead shelf >/= 10# and push/ pull >/= 15# to for  functional strength/ mobility     Time  8    Period  Weeks    Status  New    Target Date  05/21/17      PT LONG TERM GOAL #4   Title  Increase FOTO score to </= 38% limited to demo improvement in function    Time  8    Period  Weeks    Status  New    Target Date  05/21/17      PT LONG TERM GOAL #5   Title  pt to be I with all HEP given as of last visit to maintain and progress function     Time  8    Period  Weeks    Status  New    Target Date  05/21/17            Plan - 04/18/17 1018    Clinical Impression Statement  pt reports pain only at 3/10 today in the mid-distal tricep which is likely due to inproper form with isometrics. following stretching she reported decreased pain. continued progression of strengthening providing isotonics for HEP. end of session she reported 1/10 pain and that her shoulder felt normal.     PT Treatment/Interventions  ADLs/Self Care Home Management;Cryotherapy;Electrical Stimulation;Iontophoresis 4mg /ml Dexamethasone;Moist Heat;Ultrasound;Neuromuscular re-education;Patient/family education;Therapeutic activities;Therapeutic exercise;Manual techniques;Vasopneumatic Device;Taping;Passive range of motion    PT Next Visit Plan  update HEP,   scapular setting,  pt is 8 weeks out.progress per protocol. progress AAROM/ AROM, strengthening    PT Home Exercise Plan  table slides flexion/ abduction, wand ER in supine, upper trap stretching, scapular retractions. pendulums, supine cane press ups and pullovers, ER PROM using  table , wall slides, ER single arm doorway stretch, isotonics    Consulted and Agree with Plan of Care  Patient       Patient will benefit from skilled therapeutic intervention in order to improve the following deficits and impairments:  Pain, Improper body mechanics, Postural dysfunction, Decreased strength, Decreased range of  motion, Decreased endurance, Decreased activity tolerance, Increased edema  Visit Diagnosis: Chronic right shoulder pain  Muscle weakness (generalized)  Localized edema  Stiffness of right shoulder, not elsewhere classified     Problem List Patient Active Problem List   Diagnosis Date Noted  . Status post arthroscopy of right shoulder 03/04/2017  . Impingement syndrome of right shoulder 02/05/2017  . Partial tear of right rotator cuff 02/05/2017  . Chronic right shoulder pain 01/22/2017   Starr Lake PT, DPT, LAT, ATC  04/18/17  10:27 AM      [facility] [address] [place], [place], [address] Phone: [phone]   Fax:  [phone]  Name: Caitlin Price MRN: [id number] Date of Birth: 02/09/1950

## 2017-04-23 ENCOUNTER — Ambulatory Visit: Payer: BLUE CROSS/BLUE SHIELD | Admitting: Physical Therapy

## 2017-04-23 ENCOUNTER — Encounter: Payer: Self-pay | Admitting: Physical Therapy

## 2017-04-23 DIAGNOSIS — G8929 Other chronic pain: Secondary | ICD-10-CM

## 2017-04-23 DIAGNOSIS — M25511 Pain in right shoulder: Principal | ICD-10-CM

## 2017-04-23 DIAGNOSIS — R600 Localized edema: Secondary | ICD-10-CM

## 2017-04-23 DIAGNOSIS — M6281 Muscle weakness (generalized): Secondary | ICD-10-CM

## 2017-04-23 DIAGNOSIS — M25611 Stiffness of right shoulder, not elsewhere classified: Secondary | ICD-10-CM

## 2017-04-23 NOTE — Therapy (Signed)
[place], [place], [address] Phone: [phone]   Fax:  [phone]  Physical Therapy Treatment  Patient Details  Name: Caitlin Price MRN: [id number] Date of Birth: 1950-11-01 Referring Provider: Zollie Beckers MD   Encounter Date: 04/23/2017  PT End of Session - 04/23/17 0947    Visit Number  9    Number of Visits  17    Date for PT Re-Evaluation  05/21/17    PT Start Time  0930    PT Stop Time  1015    PT Time Calculation (min)  45 min       Past Medical History:  Diagnosis Date  . Cancer ([place]) 2016   skin cancer- forehead   . Hyperlipidemia   . Hypertension   . Osteopenia     Past Surgical History:  Procedure Laterality Date  . EXCISIONAL HEMORRHOIDECTOMY    . ROTATOR CUFF REPAIR Right 02/21/2017    There were no vitals filed for this visit.  Subjective Assessment - 04/23/17 0946    Currently in Pain?  No/denies    Pain Location  Shoulder    Pain Orientation  Right    Pain Descriptors / Indicators  Sore    Pain Type  Surgical pain    Aggravating Factors   stirring while cooking, simple things    Pain Relieving Factors  rest                       [facility] Adult PT Treatment/Exercise - 04/23/17 0001      Shoulder Exercises: Prone   Retraction  15 reps    Extension  15 reps      Shoulder Exercises: Sidelying   External Rotation  15 reps    ABduction  15 reps      Shoulder Exercises: Standing   External Rotation  Strengthening;Right;10 reps;Theraband    Theraband Level (Shoulder External Rotation)  Level 1 (Yellow)    Internal Rotation  Strengthening;10 reps;Theraband    Theraband Level (Shoulder Internal Rotation)  Level 1 (Yellow)    Row  Strengthening;10 reps;Theraband    Theraband Level (Shoulder Row)  Level 2 (Red)    Other Standing Exercises  cabinet reaching 5#  bottom shelf, too heavy, She will try 1.5 lb soup can at home     Other Standing Exercises   wall slides x 20       Shoulder Exercises: Pulleys   Flexion  3 minutes      Shoulder Exercises: ROM/Strengthening   UBE (Upper Arm Bike)  L2 x 6 min  changing direction at 3 min      Shoulder Exercises: Stretch   External Rotation Stretch  3 reps;30 seconds single arm in doorway      Manual Therapy   Joint Mobilization   grade II  and III A/P and inferiror glides Rt GHJ    Passive ROM  Flexion, abduction, ER, IR to tolerance with gentle oscillations working into end ranges               PT Short Term Goals - 03/26/17 1146      PT SHORT TERM GOAL #1   Title  pt to be I with inital HEP     Time  4    Period  Weeks    Status  New    Target Date  04/23/17      PT SHORT TERM GOAL #2   Title  pt to verbalize/ demo techniques to reduce pain and inflammation via RICE and HEP     Time  4    Period  Weeks    Status  New    Target Date  04/23/17      PT SHORT TERM GOAL #3   Title  pt to increase R shoulder active  flexion/ abduction to >/= 100 degrees to promote functional shoulder mobility     Time  4    Period  Weeks    Status  New    Target Date  04/23/17      PT SHORT TERM GOAL #4   Title  Increase Grip strength on R by >/= 10# to demo improvement in shoulder function         PT Long Term Goals - 03/26/17 1148      PT LONG TERM GOAL #1   Title  pt to increaes R shoulder flexion/ abduction to >/= 120 degrees and IR/ER to >/= 60 degrees assessed in 90/90 for functional mobility required for ADLs     Time  8    Period  Weeks    Status  New    Target Date  05/21/17      PT LONG TERM GOAL #2   Title  Increase R shoulder strength to >/= 4/5 to promote shoulder stability with activity.     Time  8    Period  Weeks    Status  New    Target Date  05/21/17      PT LONG TERM GOAL #3   Title  pt to be able to lift/ lower from overhead shelf >/= 10# and push/ pull >/= 15# to for  functional strength/ mobility     Time  8    Period  Weeks    Status  New     Target Date  05/21/17      PT LONG TERM GOAL #4   Title  Increase FOTO score to </= 38% limited to demo improvement in function    Time  8    Period  Weeks    Status  New    Target Date  05/21/17      PT LONG TERM GOAL #5   Title  pt to be I with all HEP given as of last visit to maintain and progress function     Time  8    Period  Weeks    Status  New    Target Date  05/21/17            Plan - 04/23/17 1024    Clinical Impression Statement  Pt doing well with progression. 2-3/10 pain with activities. She still feels something is wrong. MD thinks she is doing fine. Continued with theraband strengthening with good tolerance. Pt reports she is using 5lb and reaching over head. Clarified with primary PT who recommended 5# cabinet reach. We attempted this however too heavy. SHe will try 1.5 lb can at home. Asked her to keep it pain free.     PT Next Visit Plan  update HEP (ensure she has all pictures) ,   scapular setting,  pt is 8 weeks out.progress per protocol. progress AAROM/ AROM, strengthening    PT Home Exercise Plan  table slides flexion/ abduction, wand ER in supine, upper trap stretching, scapular retractions. pendulums, supine cane press ups and pullovers, ER PROM using table , wall slides, ER single arm doorway stretch, isotonics    Consulted  and Agree with Plan of Care  Patient       Patient will benefit from skilled therapeutic intervention in order to improve the following deficits and impairments:  Pain, Improper body mechanics, Postural dysfunction, Decreased strength, Decreased range of motion, Decreased endurance, Decreased activity tolerance, Increased edema  Visit Diagnosis: Chronic right shoulder pain  Muscle weakness (generalized)  Localized edema  Stiffness of right shoulder, not elsewhere classified     Problem List Patient Active Problem List   Diagnosis Date Noted  . Status post arthroscopy of right shoulder 03/04/2017  . Impingement syndrome  of right shoulder 02/05/2017  . Partial tear of right rotator cuff 02/05/2017  . Chronic right shoulder pain 01/22/2017    Dorene Ar, PTA 04/23/2017, 10:37 AM  [facility] [address] [place], [place], [address] Phone: [phone]   Fax:  [phone]  Name: Caitlin Price MRN: [id number] Date of Birth: 12/27/1950

## 2017-04-25 ENCOUNTER — Ambulatory Visit: Payer: BLUE CROSS/BLUE SHIELD | Admitting: Physical Therapy

## 2017-04-25 ENCOUNTER — Encounter: Payer: Self-pay | Admitting: Physical Therapy

## 2017-04-25 DIAGNOSIS — R600 Localized edema: Secondary | ICD-10-CM

## 2017-04-25 DIAGNOSIS — M25511 Pain in right shoulder: Secondary | ICD-10-CM | POA: Diagnosis not present

## 2017-04-25 DIAGNOSIS — M25611 Stiffness of right shoulder, not elsewhere classified: Secondary | ICD-10-CM

## 2017-04-25 DIAGNOSIS — M6281 Muscle weakness (generalized): Secondary | ICD-10-CM

## 2017-04-25 DIAGNOSIS — G8929 Other chronic pain: Secondary | ICD-10-CM

## 2017-04-25 NOTE — Therapy (Signed)
[place], [place], [address] Phone: [phone]   Fax:  [phone]  Physical Therapy Treatment  Patient Details  Name: Caitlin Price MRN: [id number] Date of Birth: [birth year] Referring Provider: Zollie Beckers MD   Encounter Date: 04/25/2017  PT End of Session - 04/25/17 1013    Visit Number  10    Number of Visits  17    Date for PT Re-Evaluation  05/21/17    PT Start Time  0932    PT Stop Time  1013    PT Time Calculation (min)  41 min    Activity Tolerance  Patient tolerated treatment well    Behavior During Therapy  [facility] for tasks assessed/performed       Past Medical History:  Diagnosis Date  . Cancer ([place]) 2016   skin cancer- forehead   . Hyperlipidemia   . Hypertension   . Osteopenia     Past Surgical History:  Procedure Laterality Date  . EXCISIONAL HEMORRHOIDECTOMY    . ROTATOR CUFF REPAIR Right 02/21/2017    There were no vitals filed for this visit.  Subjective Assessment - 04/25/17 0933    Subjective  "I am doing okay, I am still feeling soreness in the shoulder"     Currently in Pain?  Yes    Pain Score  3     Pain Location  Shoulder    Pain Orientation  Right    Pain Descriptors / Indicators  Sore    Pain Type  Surgical pain    Pain Onset  More than a month ago    Pain Frequency  Intermittent                       [facility] Adult PT Treatment/Exercise - 04/25/17 0936      Shoulder Exercises: Standing   External Rotation  Strengthening;Right;10 reps;Theraband    Theraband Level (Shoulder External Rotation)  Level 2 (Red)    Internal Rotation  Strengthening;10 reps;Theraband    Theraband Level (Shoulder Internal Rotation)  Level 2 (Red)      Shoulder Exercises: Pulleys   Flexion  2 minutes verbal cues required to keep shoulder from hiking    Scaption  2 minutes verbal cues needed to keep shoulder from hiking.       Shoulder Exercises:  ROM/Strengthening   UBE (Upper Arm Bike)  L2 x 6 min  changing direction at 3 min      Shoulder Exercises: Stretch   External Rotation Stretch  2 reps;30 seconds    Other Shoulder Stretches  R upper trap stretch 2 x 30 sec    Other Shoulder Stretches  corner stretch 2 x 30 sec      Manual Therapy   Manual therapy comments  MTPR along the pec major/ minor, and triceps teres minor    Passive ROM  Flexion, abduction, ER, IR to tolerance with gentle oscillations working into end ranges             PT Education - 04/25/17 0949    Education provided  Yes    Education Details  updated HEP for pec and sleeper stretching, reviewed previously provided HEP extra time taken to make sure she is doing HEP correctly.     Person(s) Educated  Patient    Methods  Explanation;Verbal cues    Comprehension  Verbalized understanding;Verbal cues required       PT Short Term Goals - 03/26/17  [place] #1   Title  pt to be I with inital HEP     Time  4    Period  Weeks    Status  New    Target Date  04/23/17      PT SHORT TERM GOAL #2   Title  pt to verbalize/ demo techniques to reduce pain and inflammation via RICE and HEP     Time  4    Period  Weeks    Status  New    Target Date  04/23/17      PT SHORT TERM GOAL #3   Title  pt to increase R shoulder active  flexion/ abduction to >/= 100 degrees to promote functional shoulder mobility     Time  4    Period  Weeks    Status  New    Target Date  04/23/17      PT SHORT TERM GOAL #4   Title  Increase Grip strength on R by >/= 10# to demo improvement in shoulder function         PT Long Term Goals - 03/26/17 1148      PT LONG TERM GOAL #1   Title  pt to increaes R shoulder flexion/ abduction to >/= 120 degrees and IR/ER to >/= 60 degrees assessed in 90/90 for functional mobility required for ADLs     Time  8    Period  Weeks    Status  New    Target Date  05/21/17      PT LONG TERM GOAL #2   Title  Increase  R shoulder strength to >/= 4/5 to promote shoulder stability with activity.     Time  8    Period  Weeks    Status  New    Target Date  05/21/17      PT LONG TERM GOAL #3   Title  pt to be able to lift/ lower from overhead shelf >/= 10# and push/ pull >/= 15# to for  functional strength/ mobility     Time  8    Period  Weeks    Status  New    Target Date  05/21/17      PT LONG TERM GOAL #4   Title  Increase FOTO score to </= 38% limited to demo improvement in function    Time  8    Period  Weeks    Status  New    Target Date  05/21/17      PT LONG TERM GOAL #5   Title  pt to be I with all HEP given as of last visit to maintain and progress function     Time  8    Period  Weeks    Status  New    Target Date  05/21/17            Plan - 04/25/17 1014    Clinical Impression Statement  pt reported pain initially today at 3/10. following MTPR of the teres minor, pec, and proximal tricep she reported significant relief of pain. continued Rotator cuff strengthening increasing resistance band to red. updated HEP for stretches extra time utilized reviewing HEP and avoiding doing too much. end of session she reported no pain and declined modalities.     PT Next Visit Plan  update HEP (ensure she has all pictures) ,   scapular setting,  pt is 9 weeks out.progress per  protocol. progress AAROM/ AROM, strengthening. progress note for MD next visit.     PT Home Exercise Plan  table slides flexion/ abduction, wand ER in supine, upper trap stretching, scapular retractions. pendulums, supine cane press ups and pullovers, ER PROM using table , wall slides, ER single arm doorway stretch, isotonics    Consulted and Agree with Plan of Care  Patient       Patient will benefit from skilled therapeutic intervention in order to improve the following deficits and impairments:  Pain, Improper body mechanics, Postural dysfunction, Decreased strength, Decreased range of motion, Decreased endurance,  Decreased activity tolerance, Increased edema  Visit Diagnosis: Chronic right shoulder pain  Muscle weakness (generalized)  Localized edema  Stiffness of right shoulder, not elsewhere classified     Problem List Patient Active Problem List   Diagnosis Date Noted  . Status post arthroscopy of right shoulder 03/04/2017  . Impingement syndrome of right shoulder 02/05/2017  . Partial tear of right rotator cuff 02/05/2017  . Chronic right shoulder pain 01/22/2017   Starr Lake PT, DPT, LAT, ATC  04/25/17  10:16 AM      [facility] [address] [place], [place], [address] Phone: [phone]   Fax:  [phone]  Name: Caitlin Price MRN: [id number] Date of Birth: October 14, 1950

## 2017-04-30 ENCOUNTER — Ambulatory Visit: Payer: BLUE CROSS/BLUE SHIELD | Admitting: Physical Therapy

## 2017-04-30 ENCOUNTER — Encounter: Payer: Self-pay | Admitting: Physical Therapy

## 2017-04-30 DIAGNOSIS — M25511 Pain in right shoulder: Principal | ICD-10-CM

## 2017-04-30 DIAGNOSIS — M6281 Muscle weakness (generalized): Secondary | ICD-10-CM

## 2017-04-30 DIAGNOSIS — G8929 Other chronic pain: Secondary | ICD-10-CM

## 2017-04-30 DIAGNOSIS — R600 Localized edema: Secondary | ICD-10-CM

## 2017-04-30 DIAGNOSIS — M25611 Stiffness of right shoulder, not elsewhere classified: Secondary | ICD-10-CM

## 2017-04-30 NOTE — Therapy (Signed)
[place], [place], [address] Phone: [phone]   Fax:  [phone]  Physical Therapy Treatment  Patient Details  Name: Caitlin Price MRN: [id number] Date of Birth: [birth year] Referring Provider: Zollie Beckers MD   Encounter Date: 04/30/2017  PT End of Session - 04/30/17 0937    Visit Number  11    Number of Visits  17    Date for PT Re-Evaluation  05/21/17    PT Start Time  0930    PT Stop Time  1015    PT Time Calculation (min)  45 min       Past Medical History:  Diagnosis Date  . Cancer ([place]) 2016   skin cancer- forehead   . Hyperlipidemia   . Hypertension   . Osteopenia     Past Surgical History:  Procedure Laterality Date  . EXCISIONAL HEMORRHOIDECTOMY    . ROTATOR CUFF REPAIR Right 02/21/2017    There were no vitals filed for this visit.  Subjective Assessment - 04/30/17 0936    Subjective  I see the doctor tomorrow. Pain still a 3/10.     Currently in Pain?  Yes    Pain Score  3     Pain Location  Shoulder    Pain Orientation  Right    Pain Descriptors / Indicators  Sore;Burning stinging    Aggravating Factors   moving it alot, moving fast, wiping the counter     Pain Relieving Factors  rest, not using it.          [facility] PT Assessment - 04/30/17 0001      AROM   Right Shoulder Flexion  135 Degrees    Right Shoulder ABduction  135 Degrees    Right Shoulder Internal Rotation  -- reach to T-2 behind head    Right Shoulder External Rotation  -- reach to L2 behind back      PROM   Right Shoulder Flexion  150 Degrees    Right Shoulder ABduction  150 Degrees    Right Shoulder Internal Rotation  70 Degrees    Right Shoulder External Rotation  55 Degrees      Strength   Right/Left Shoulder  Right    Right Shoulder Flexion  4-/5    Right Shoulder ABduction  4-/5    Right Shoulder Internal Rotation  4/5    Right Shoulder External Rotation  4/5                    [facility] Adult PT Treatment/Exercise - 04/30/17 0001      Shoulder Exercises: Supine   Horizontal ABduction  15 reps red    Flexion  Strengthening;Right;10 reps 2# elbow flexed, 1# elbow extended    Other Supine Exercises  tricep extension 2#      Shoulder Exercises: Sidelying   External Rotation  15 reps 1#    ABduction  15 reps 1#      Shoulder Exercises: Standing   External Rotation  Strengthening;Right;10 reps;Theraband    Theraband Level (Shoulder External Rotation)  Level 2 (Red)    Internal Rotation  Strengthening;10 reps;Theraband    Theraband Level (Shoulder Internal Rotation)  Level 2 (Red)    Other Standing Exercises  cabinet reaching 2# bottom shelf    Other Standing Exercises  cane IR and extension AAROM      Shoulder Exercises: Pulleys   Flexion  2 minutes verbal cues required to keep shoulder from hiking  Scaption  2 minutes verbal cues needed to keep shoulder from hiking.       Shoulder Exercises: ROM/Strengthening   UBE (Upper Arm Bike)  L2 x 6 min  changing direction at 3 min      Shoulder Exercises: Stretch   Internal Rotation Stretch  10 seconds x 3       Manual Therapy   Passive ROM  Flexion, abduction, ER, IR to tolerance with gentle oscillations working into end ranges               PT Short Term Goals - 04/30/17 1003      PT SHORT TERM GOAL #1   Title  pt to be I with inital HEP     Time  4    Period  Weeks    Status  Achieved      PT SHORT TERM GOAL #2   Title  pt to verbalize/ demo techniques to reduce pain and inflammation via RICE and HEP     Baseline  does not use ice, knows she should     Time  4    Period  Weeks    Status  Achieved      PT SHORT TERM GOAL #3   Title  pt to increase R shoulder active  flexion/ abduction to >/= 100 degrees to promote functional shoulder mobility     Time  4    Period  Weeks    Status  Achieved      PT SHORT TERM GOAL #4   Title  Increase Grip strength on R by >/=  10# to demo improvement in shoulder function     Baseline  4    Period  Weeks    Status  Unable to assess        PT Long Term Goals - 03/26/17 1148      PT LONG TERM GOAL #1   Title  pt to increaes R shoulder flexion/ abduction to >/= 120 degrees and IR/ER to >/= 60 degrees assessed in 90/90 for functional mobility required for ADLs     Time  8    Period  Weeks    Status  New    Target Date  05/21/17      PT LONG TERM GOAL #2   Title  Increase R shoulder strength to >/= 4/5 to promote shoulder stability with activity.     Time  8    Period  Weeks    Status  New    Target Date  05/21/17      PT LONG TERM GOAL #3   Title  pt to be able to lift/ lower from overhead shelf >/= 10# and push/ pull >/= 15# to for  functional strength/ mobility     Time  8    Period  Weeks    Status  New    Target Date  05/21/17      PT LONG TERM GOAL #4   Title  Increase FOTO score to </= 38% limited to demo improvement in function    Time  8    Period  Weeks    Status  New    Target Date  05/21/17      PT LONG TERM GOAL #5   Title  pt to be I with all HEP given as of last visit to maintain and progress function     Time  8    Period  Weeks    Status  New    Target Date  05/21/17            Plan - 04/30/17 1004    Clinical Impression Statement  Pt reports pain level at 3/10 with using her arm for wiping the counter and during fast motions. She avoids using her arm for most household tasks. Her AROM and Strength have imporoved. She is tolerating strengthening well. She is independent with her HEP. STG#1,2,3 met. She will see MD tomorrow for follow up.     PT Next Visit Plan  update HEP (ensure she has all pictures) ,   scapular setting,  pt is 9 weeks out.progress per protocol. progress AAROM/ AROM, strengthening. begin UE closed chain? check grip strength.     PT Home Exercise Plan  table slides flexion/ abduction, wand ER in supine, upper trap stretching, scapular retractions.  pendulums, supine cane press ups and pullovers, ER PROM using table , wall slides, ER single arm doorway stretch, isotonics    Consulted and Agree with Plan of Care  Patient       Patient will benefit from skilled therapeutic intervention in order to improve the following deficits and impairments:  Pain, Improper body mechanics, Postural dysfunction, Decreased strength, Decreased range of motion, Decreased endurance, Decreased activity tolerance, Increased edema  Visit Diagnosis: Chronic right shoulder pain  Muscle weakness (generalized)  Localized edema  Stiffness of right shoulder, not elsewhere classified     Problem List Patient Active Problem List   Diagnosis Date Noted  . Status post arthroscopy of right shoulder 03/04/2017  . Impingement syndrome of right shoulder 02/05/2017  . Partial tear of right rotator cuff 02/05/2017  . Chronic right shoulder pain 01/22/2017    Caitlin Price, [place] 04/30/2017, 10:23 AM  [facility] [address] [place], [place], [address] Phone: [phone]   Fax:  [phone]  Name: Caitlin Price MRN: [id number] Date of Birth: 1950-05-24

## 2017-05-01 ENCOUNTER — Ambulatory Visit (INDEPENDENT_AMBULATORY_CARE_PROVIDER_SITE_OTHER): Payer: BLUE CROSS/BLUE SHIELD | Admitting: Orthopaedic Surgery

## 2017-05-01 ENCOUNTER — Encounter (INDEPENDENT_AMBULATORY_CARE_PROVIDER_SITE_OTHER): Payer: Self-pay | Admitting: Orthopaedic Surgery

## 2017-05-01 DIAGNOSIS — M25511 Pain in right shoulder: Secondary | ICD-10-CM | POA: Diagnosis not present

## 2017-05-01 DIAGNOSIS — Z9889 Other specified postprocedural states: Secondary | ICD-10-CM

## 2017-05-01 MED ORDER — LIDOCAINE HCL 1 % IJ SOLN
3.0000 mL | INTRAMUSCULAR | Status: AC | PRN
  Administered 2017-05-01: 3 mL

## 2017-05-01 MED ORDER — GABAPENTIN 300 MG PO CAPS: 300.0000 mg | ORAL_CAPSULE | Freq: Every day | ORAL | 1 refills | Status: DC

## 2017-05-01 MED ORDER — METHYLPREDNISOLONE ACETATE 40 MG/ML IJ SUSP
40.0000 mg | INTRAMUSCULAR | Status: AC | PRN
  Administered 2017-05-01: 40 mg via INTRA_ARTICULAR

## 2017-05-01 NOTE — Progress Notes (Signed)
Office Visit Note   Patient: Caitlin Price           Date of Birth: 03-29-1950           MRN: [id number] Visit Date: 05/01/2017              Requested by: Wenda Low, MD [address] [place], [place] [address] PCP: Wenda Low, MD   Assessment & Plan: Visit Diagnoses:  1. Status post arthroscopy of right shoulder     Plan: I do feel that she would benefit from a subacromial steroid injection and I explained the reasoning behind this today as well as the risks.  She tolerated it well.  I will put her on Neurontin 300 mg at bedtime to see if that would help with her burning symptoms.  I will see her back in a month after continued therapy.  All questions concerns were answered and addressed.  Follow-Up Instructions: Return in about 1 month (around 05/29/2017).   Orders:  Orders Placed This Encounter  Procedures  . Large Joint Inj   Meds ordered this encounter  Medications  . gabapentin (NEURONTIN) 300 MG capsule    Sig: Take 1 capsule (300 mg total) by mouth at bedtime.    Dispense:  30 capsule    Refill:  1      Procedures: Large Joint Inj: R subacromial bursa on 05/01/2017 3:21 PM Indications: pain and diagnostic evaluation Details: 22 G 1.5 in needle  Arthrogram: No  Medications: 3 mL lidocaine 1 %; 40 mg methylPREDNISolone acetate 40 MG/ML Outcome: tolerated well, no immediate complications Procedure, treatment alternatives, risks and benefits explained, specific risks discussed. Consent was given by the patient. Immediately prior to procedure a time out was called to verify the correct patient, procedure, equipment, support staff and site/side marked as required. Patient was prepped and draped in the usual sterile fashion.       Clinical Data: No additional findings.   Subjective: Chief Complaint  Patient presents with  . Right Shoulder - Follow-up, Pain  The patient is following up almost 2 months status post right shoulder  arthroscopy with significant and severe impingement right shoulder.  She still struggling with burning type sensation and pain in her shoulder.  She is been going to physical therapy as well.  HPI  Review of Systems She denies any fever, chills, nausea, vomiting.  Objective: Vital Signs: There were no vitals taken for this visit.  Physical Exam She is alert and oriented in no acute distress Ortho Exam Examination of her right operative shoulder shows no swelling no redness.  She has limited mobility still with that shoulder with range of motion. Specialty Comments:  No specialty comments available.  Imaging: No results found.   PMFS History: Patient Active Problem List   Diagnosis Date Noted  . Status post arthroscopy of right shoulder 03/04/2017  . Impingement syndrome of right shoulder 02/05/2017  . Partial tear of right rotator cuff 02/05/2017  . Chronic right shoulder pain 01/22/2017   Past Medical History:  Diagnosis Date  . Cancer ([place]) 2016   skin cancer- forehead   . Hyperlipidemia   . Hypertension   . Osteopenia     Family History  Problem Relation Age of Onset  . Colon cancer Maternal Uncle   . Heart attack Mother   . Heart attack Father   . Diabetes Sister   . Diabetes Maternal Grandfather   . Diabetes Paternal Grandmother  Past Surgical History:  Procedure Laterality Date  . EXCISIONAL HEMORRHOIDECTOMY    . ROTATOR CUFF REPAIR Right 02/21/2017   Social History   Occupational History  . Not on file  Tobacco Use  . Smoking status: Never Smoker  . Smokeless tobacco: Never Used  Substance and Sexual Activity  . Alcohol use: No    Alcohol/week: 0.0 oz  . Drug use: No  . Sexual activity: Not Currently    Comment: 1st intercourse- 22, partners-  1,

## 2017-05-02 ENCOUNTER — Ambulatory Visit: Payer: BLUE CROSS/BLUE SHIELD | Admitting: Physical Therapy

## 2017-05-07 ENCOUNTER — Encounter: Payer: Self-pay | Admitting: Physical Therapy

## 2017-05-07 ENCOUNTER — Ambulatory Visit: Payer: BLUE CROSS/BLUE SHIELD | Admitting: Physical Therapy

## 2017-05-07 DIAGNOSIS — R600 Localized edema: Secondary | ICD-10-CM

## 2017-05-07 DIAGNOSIS — M25511 Pain in right shoulder: Secondary | ICD-10-CM | POA: Diagnosis not present

## 2017-05-07 DIAGNOSIS — M6281 Muscle weakness (generalized): Secondary | ICD-10-CM

## 2017-05-07 DIAGNOSIS — G8929 Other chronic pain: Secondary | ICD-10-CM

## 2017-05-07 DIAGNOSIS — M25611 Stiffness of right shoulder, not elsewhere classified: Secondary | ICD-10-CM

## 2017-05-07 NOTE — Therapy (Signed)
[place], [place], [address] Phone: [phone]   Fax:  [phone]  Physical Therapy Treatment  Patient Details  Name: Caitlin Price MRN: [id number] Date of Birth: 04-18-1950 Referring Provider: Zollie Beckers MD   Encounter Date: 05/07/2017  PT End of Session - 05/07/17 0849    Visit Number  12    Number of Visits  17    Date for PT Re-Evaluation  05/21/17    PT Start Time  0845    PT Stop Time  0925    PT Time Calculation (min)  40 min       Past Medical History:  Diagnosis Date  . Cancer ([place]) 2016   skin cancer- forehead   . Hyperlipidemia   . Hypertension   . Osteopenia     Past Surgical History:  Procedure Laterality Date  . EXCISIONAL HEMORRHOIDECTOMY    . ROTATOR CUFF REPAIR Right 02/21/2017    There were no vitals filed for this visit.  Subjective Assessment - 05/07/17 0848    Subjective  I had an injection, and was more sore but I have less pain.     Currently in Pain?  No/denies         [facility] PT Assessment - 05/07/17 0001      Strength   Right Hand Grip (lbs)  58.6 59,59,58                   [facility] Adult PT Treatment/Exercise - 05/07/17 0001      Shoulder Exercises: Supine   Protraction  15 reps 2#    Horizontal ABduction  15 reps red    Flexion  Strengthening;Right;10 reps 2# elbow flexed and extended     Other Supine Exercises  tricep extension 2#      Shoulder Exercises: Prone   Other Prone Exercises  quadruped UE weight shifting 5 sec x 5 onto right UE       Shoulder Exercises: Sidelying   External Rotation  15 reps 2#    ABduction  15 reps 2#       Shoulder Exercises: Standing   External Rotation  Strengthening;15 reps    Theraband Level (Shoulder External Rotation)  Level 2 (Red)    Internal Rotation  15 reps    Theraband Level (Shoulder Internal Rotation)  Level 2 (Red)    Extension  Strengthening;Both;20 reps;Theraband    Theraband  Level (Shoulder Extension)  Level 2 (Red)    Row  Strengthening;20 reps;Theraband    Theraband Level (Shoulder Row)  Level 2 (Red)    Other Standing Exercises  cabinet reaching 2# bottom shelf, flexion and abduction    Other Standing Exercises  cane IR and extension AAROM, wall UE weight shifts, then wall push ups       Shoulder Exercises: Pulleys   Flexion  2 minutes verbal cues required to keep shoulder from hiking    Scaption  2 minutes verbal cues needed to keep shoulder from hiking.       Shoulder Exercises: ROM/Strengthening   UBE (Upper Arm Bike)  L2 x 6 min  changing direction at 3 min      Shoulder Exercises: Stretch   Internal Rotation Stretch  10 seconds x 3     Other Shoulder Stretches  corner stretch 3 x 30 sec      Manual Therapy   Passive ROM  Flexion, abduction, ER, IR to tolerance with gentle oscillations working into end ranges  PT Short Term Goals - 05/07/17 0929      PT SHORT TERM GOAL #1   Title  pt to be I with inital HEP     Status  Achieved      PT SHORT TERM GOAL #2   Title  pt to verbalize/ demo techniques to reduce pain and inflammation via RICE and HEP     Status  Achieved      PT SHORT TERM GOAL #3   Title  pt to increase R shoulder active  flexion/ abduction to >/= 100 degrees to promote functional shoulder mobility     Status  Achieved      PT SHORT TERM GOAL #4   Title  Increase Grip strength on R by >/= 10# to demo improvement in shoulder function     Status  Achieved        PT Long Term Goals - 03/26/17 1148      PT LONG TERM GOAL #1   Title  pt to increaes R shoulder flexion/ abduction to >/= 120 degrees and IR/ER to >/= 60 degrees assessed in 90/90 for functional mobility required for ADLs     Time  8    Period  Weeks    Status  New    Target Date  05/21/17      PT LONG TERM GOAL #2   Title  Increase R shoulder strength to >/= 4/5 to promote shoulder stability with activity.     Time  8    Period  Weeks     Status  New    Target Date  05/21/17      PT LONG TERM GOAL #3   Title  pt to be able to lift/ lower from overhead shelf >/= 10# and push/ pull >/= 15# to for  functional strength/ mobility     Time  8    Period  Weeks    Status  New    Target Date  05/21/17      PT LONG TERM GOAL #4   Title  Increase FOTO score to </= 38% limited to demo improvement in function    Time  8    Period  Weeks    Status  New    Target Date  05/21/17      PT LONG TERM GOAL #5   Title  pt to be I with all HEP given as of last visit to maintain and progress function     Time  8    Period  Weeks    Status  New    Target Date  05/21/17            Plan - 05/07/17 0849    Clinical Impression Statement  pt reports increased soreness from shoulder injection 6 days ago. Today, she reports soreness decreasing and she notices less overal pain especially with wiping down conter tops. Grip strength improved, STG# 4 met. Began UE closed chain exercises with good tolerance.     PT Next Visit Plan  update HEP (ensure she has all pictures) ,   scapular setting,  pt is 9 weeks out.progress per protocol. progress AAROM/ AROM, strengthening. begin UE closed chain? check grip strength.     PT Home Exercise Plan  table slides flexion/ abduction, wand ER in supine, upper trap stretching, scapular retractions. pendulums, supine cane press ups and pullovers, ER PROM using table , wall slides, ER single arm doorway stretch, isotonics    Consulted and Agree  with Plan of Care  Patient       Patient will benefit from skilled therapeutic intervention in order to improve the following deficits and impairments:  Pain, Improper body mechanics, Postural dysfunction, Decreased strength, Decreased range of motion, Decreased endurance, Decreased activity tolerance, Increased edema  Visit Diagnosis: Chronic right shoulder pain  Muscle weakness (generalized)  Localized edema  Stiffness of right shoulder, not elsewhere  classified     Problem List Patient Active Problem List   Diagnosis Date Noted  . Status post arthroscopy of right shoulder 03/04/2017  . Impingement syndrome of right shoulder 02/05/2017  . Partial tear of right rotator cuff 02/05/2017  . Chronic right shoulder pain 01/22/2017    Dorene Ar, PTA 05/07/2017, 9:30 AM  [place], [place], [address] Phone: [phone]   Fax:  [phone]  Name: FRANCI OSHANA MRN: [id number] Date of Birth: [birth year]

## 2017-05-09 ENCOUNTER — Encounter: Payer: Self-pay | Admitting: Physical Therapy

## 2017-05-09 ENCOUNTER — Ambulatory Visit: Payer: BLUE CROSS/BLUE SHIELD | Attending: Orthopaedic Surgery | Admitting: Physical Therapy

## 2017-05-09 DIAGNOSIS — G8929 Other chronic pain: Secondary | ICD-10-CM

## 2017-05-09 DIAGNOSIS — M25511 Pain in right shoulder: Secondary | ICD-10-CM | POA: Diagnosis not present

## 2017-05-09 DIAGNOSIS — R600 Localized edema: Secondary | ICD-10-CM | POA: Diagnosis present

## 2017-05-09 DIAGNOSIS — M6281 Muscle weakness (generalized): Secondary | ICD-10-CM | POA: Diagnosis present

## 2017-05-09 DIAGNOSIS — M25611 Stiffness of right shoulder, not elsewhere classified: Secondary | ICD-10-CM | POA: Diagnosis present

## 2017-05-09 NOTE — Therapy (Signed)
[place], [place], [address] Phone: [phone]   Fax:  [phone]  Physical Therapy Treatment  Patient Details  Name: Caitlin Price MRN: [id number] Date of Birth: Sep 14, 1950 Referring Provider: Zollie Beckers MD   Encounter Date: 05/09/2017  PT End of Session - 05/09/17 0913    Visit Number  13    Number of Visits  17    Date for PT Re-Evaluation  05/21/17    PT Start Time  0844    PT Stop Time  0925    PT Time Calculation (min)  41 min    Activity Tolerance  Patient tolerated treatment well    Behavior During Therapy  [facility] for tasks assessed/performed       Past Medical History:  Diagnosis Date  . Cancer ([place]) 2016   skin cancer- forehead   . Hyperlipidemia   . Hypertension   . Osteopenia     Past Surgical History:  Procedure Laterality Date  . EXCISIONAL HEMORRHOIDECTOMY    . ROTATOR CUFF REPAIR Right 02/21/2017    There were no vitals filed for this visit.  Subjective Assessment - 05/09/17 0843    Subjective  "I am doing better today with no pain"     Currently in Pain?  No/denies    Aggravating Factors   some soreness noted after exercise but doesn't last as long    Pain Relieving Factors  resting, ice as needed                       [facility] Adult PT Treatment/Exercise - 05/09/17 0848      Shoulder Exercises: Supine   Diagonals  10 reps;Theraband;Right yellow band D1 and D2      Shoulder Exercises: Standing   External Rotation  Strengthening;15 reps;Right    Theraband Level (Shoulder External Rotation)  Level 3 (Green)    Internal Rotation  15 reps;Strengthening;Right    Theraband Level (Shoulder Internal Rotation)  Level 3 (Green)    Extension  Strengthening;Both;Theraband;15 reps    Theraband Level (Shoulder Extension)  Level 3 (Green)    Medical sales representative;Theraband;15 reps    Theraband Level (Shoulder Row)  Level 3 (Green)    Other Standing Exercises   cabinet reaching 1# bottom shelf, flexion and abduction 5 x lower shelf, 10 x middle shelf    Other Standing Exercises  scaption 2 x 10 with 1#, Wall Y's with lift off for lower trap activation 1 x 10 holding 2 sec      Shoulder Exercises: ROM/Strengthening   UBE (Upper Arm Bike)  L1 x 8 min changing direction at 4 min    Other ROM/Strengthening Exercises  UE ranger flexion/ abduction, and IR reaching behind the back 1 x 10 each      Shoulder Exercises: Stretch   Internal Rotation Stretch  --    External Rotation Stretch  30 seconds    Other Shoulder Stretches  R upper trap stretch 2 x 30 sec    Other Shoulder Stretches  corner stretch 2 x 30 sec      Manual Therapy   Scapular Mobilization  upward scapular rotation assisted performed with reaching into cabinet with 2#               PT Short Term Goals - 05/07/17 3557      PT SHORT TERM GOAL #1   Title  pt to be I with inital HEP  Status  Achieved      PT SHORT TERM GOAL #2   Title  pt to verbalize/ demo techniques to reduce pain and inflammation via RICE and HEP     Status  Achieved      PT SHORT TERM GOAL #3   Title  pt to increase R shoulder active  flexion/ abduction to >/= 100 degrees to promote functional shoulder mobility     Status  Achieved      PT SHORT TERM GOAL #4   Title  Increase Grip strength on R by >/= 10# to demo improvement in shoulder function     Status  Achieved        PT Long Term Goals - 03/26/17 1148      PT LONG TERM GOAL #1   Title  pt to increaes R shoulder flexion/ abduction to >/= 120 degrees and IR/ER to >/= 60 degrees assessed in 90/90 for functional mobility required for ADLs     Time  8    Period  Weeks    Status  New    Target Date  05/21/17      PT LONG TERM GOAL #2   Title  Increase R shoulder strength to >/= 4/5 to promote shoulder stability with activity.     Time  8    Period  Weeks    Status  New    Target Date  05/21/17      PT LONG TERM GOAL #3   Title  pt to  be able to lift/ lower from overhead shelf >/= 10# and push/ pull >/= 15# to for  functional strength/ mobility     Time  8    Period  Weeks    Status  New    Target Date  05/21/17      PT LONG TERM GOAL #4   Title  Increase FOTO score to </= 38% limited to demo improvement in function    Time  8    Period  Weeks    Status  New    Target Date  05/21/17      PT LONG TERM GOAL #5   Title  pt to be I with all HEP given as of last visit to maintain and progress function     Time  8    Period  Weeks    Status  New    Target Date  05/21/17            Plan - 05/09/17 1324    Clinical Impression Statement  pt arrived with no pain today. continued progression go strengthening of the rotator cuff and increased reps to promote endurance componenet. practiced reaching above head with 1# weight to promote mobility and functional strength. end of session reported no increase pain and visual improvement of shoulde rmobility and pt declined modalities.     PT Treatment/Interventions  ADLs/Self Care Home Management;Cryotherapy;Electrical Stimulation;Iontophoresis 4mg /ml Dexamethasone;Moist Heat;Ultrasound;Neuromuscular re-education;Patient/family education;Therapeutic activities;Therapeutic exercise;Manual techniques;Vasopneumatic Device;Taping;Passive range of motion    PT Next Visit Plan  update HEP (ensure she has all pictures) ,   scapular setting,  pt is 9 weeks out.progress per protocol. progress AAROM/ AROM, strengthening. begin UE closed chain? check grip strength.     PT Home Exercise Plan  table slides flexion/ abduction, wand ER in supine, upper trap stretching, scapular retractions. pendulums, supine cane press ups and pullovers, ER PROM using table , wall slides, ER single arm doorway stretch, isotonics    Consulted and  Agree with Plan of Care  Patient       Patient will benefit from skilled therapeutic intervention in order to improve the following deficits and impairments:  Pain,  Improper body mechanics, Postural dysfunction, Decreased strength, Decreased range of motion, Decreased endurance, Decreased activity tolerance, Increased edema  Visit Diagnosis: Chronic right shoulder pain  Muscle weakness (generalized)  Localized edema  Stiffness of right shoulder, not elsewhere classified     Problem List Patient Active Problem List   Diagnosis Date Noted  . Status post arthroscopy of right shoulder 03/04/2017  . Impingement syndrome of right shoulder 02/05/2017  . Partial tear of right rotator cuff 02/05/2017  . Chronic right shoulder pain 01/22/2017   Starr [place] PT, DPT, LAT, ATC  05/09/17  9:28 AM      [facility] [address] [place], [place], [address] Phone: [phone]   Fax:  [phone]  Name: Caitlin Price MRN: [id number] Date of Birth: Nov 22, 1950

## 2017-05-14 ENCOUNTER — Ambulatory Visit: Payer: BLUE CROSS/BLUE SHIELD | Admitting: Physical Therapy

## 2017-05-14 ENCOUNTER — Encounter: Payer: Self-pay | Admitting: Physical Therapy

## 2017-05-14 DIAGNOSIS — M25511 Pain in right shoulder: Secondary | ICD-10-CM | POA: Diagnosis not present

## 2017-05-14 DIAGNOSIS — M6281 Muscle weakness (generalized): Secondary | ICD-10-CM

## 2017-05-14 DIAGNOSIS — G8929 Other chronic pain: Secondary | ICD-10-CM

## 2017-05-14 DIAGNOSIS — M25611 Stiffness of right shoulder, not elsewhere classified: Secondary | ICD-10-CM

## 2017-05-14 DIAGNOSIS — R600 Localized edema: Secondary | ICD-10-CM

## 2017-05-14 NOTE — Therapy (Signed)
[place], [place], [address] Phone: [phone]   Fax:  [phone]  Physical Therapy Treatment  Patient Details  Name: Caitlin Price MRN: [id number] Date of Birth: [birth year] Referring Provider: Zollie Beckers MD   Encounter Date: 05/14/2017  PT End of Session - 05/14/17 0911    Visit Number  14    Number of Visits  17    Date for PT Re-Evaluation  05/21/17    PT Start Time  0846    PT Stop Time  0929    PT Time Calculation (min)  43 min    Activity Tolerance  Patient tolerated treatment well    Behavior During Therapy  [facility] for tasks assessed/performed       Past Medical History:  Diagnosis Date  . Cancer ([place]) 2016   skin cancer- forehead   . Hyperlipidemia   . Hypertension   . Osteopenia     Past Surgical History:  Procedure Laterality Date  . EXCISIONAL HEMORRHOIDECTOMY    . ROTATOR CUFF REPAIR Right 02/21/2017    There were no vitals filed for this visit.  Subjective Assessment - 05/14/17 0850    Subjective  "no pain today, have been doing pretty well"     Currently in Pain?  Yes                       [facility] Adult PT Treatment/Exercise - 05/14/17 0852      Shoulder Exercises: Supine   Other Supine Exercises  scapular series with horizontal abduction, shoulder flexion with scapular setting with red theraband around hands, D2 and scapular retraction with bil ER, 2x 15 ea exept for scapular setting 2 x 10      Shoulder Exercises: Standing   Other Standing Exercises  --    Other Standing Exercises  --      Shoulder Exercises: ROM/Strengthening   UBE (Upper Arm Bike)  L2 x 8 min  changing direction at 4 min     Other ROM/Strengthening Exercises  tricep strengthening regular and reverse grip 4# 2 x 10    Other ROM/Strengthening Exercises  bicep curl 2 x 10 4#      Shoulder Exercises: Stretch   External Rotation Stretch  30 seconds    Other Shoulder Stretches  R  upper trap stretch 2 x 30 sec, sleeper stretche 2 x 30 sec 3 way bicep stretch     Other Shoulder Stretches  doorway streth 2 x 30       Manual Therapy   Manual therapy comments  MTPR along bicep brachii x 3               PT Short Term Goals - 05/07/17 1062      PT SHORT TERM GOAL #1   Title  pt to be I with inital HEP     Status  Achieved      PT SHORT TERM GOAL #2   Title  pt to verbalize/ demo techniques to reduce pain and inflammation via RICE and HEP     Status  Achieved      PT SHORT TERM GOAL #3   Title  pt to increase R shoulder active  flexion/ abduction to >/= 100 degrees to promote functional shoulder mobility     Status  Achieved      PT SHORT TERM GOAL #4   Title  Increase Grip strength on R by >/= 10# to demo  improvement in shoulder function     Status  Achieved        PT Long Term Goals - 03/26/17 1148      PT LONG TERM GOAL #1   Title  pt to increaes R shoulder flexion/ abduction to >/= 120 degrees and IR/ER to >/= 60 degrees assessed in 90/90 for functional mobility required for ADLs     Time  8    Period  Weeks    Status  New    Target Date  05/21/17      PT LONG TERM GOAL #2   Title  Increase R shoulder strength to >/= 4/5 to promote shoulder stability with activity.     Time  8    Period  Weeks    Status  New    Target Date  05/21/17      PT LONG TERM GOAL #3   Title  pt to be able to lift/ lower from overhead shelf >/= 10# and push/ pull >/= 15# to for  functional strength/ mobility     Time  8    Period  Weeks    Status  New    Target Date  05/21/17      PT LONG TERM GOAL #4   Title  Increase FOTO score to </= 38% limited to demo improvement in function    Time  8    Period  Weeks    Status  New    Target Date  05/21/17      PT LONG TERM GOAL #5   Title  pt to be I with all HEP given as of last visit to maintain and progress function     Time  8    Period  Weeks    Status  New    Target Date  05/21/17             Plan - 05/14/17 0915    Clinical Impression Statement  pt continued to report no pain today. continued working on shoulder strength, scapular setting/ stability. continued working on functional lifting. She performed all exercises well reporting no pain but did fatigue quickly. Utilized STW to calm down bicep brachii soreness/ tightness. end of session she reported no pain and declined modalities.     PT Treatment/Interventions  ADLs/Self Care Home Management;Cryotherapy;Electrical Stimulation;Iontophoresis 4mg /ml Dexamethasone;Moist Heat;Ultrasound;Neuromuscular re-education;Patient/family education;Therapeutic activities;Therapeutic exercise;Manual techniques;Vasopneumatic Device;Taping;Passive range of motion    PT Next Visit Plan  update HEP (ensure she has all pictures) ,   scapular setting,  pt is 9 weeks out.progress per protocol. progress AAROM/ AROM, strengthening. begin UE closed chain? check grip strength.     PT Home Exercise Plan  table slides flexion/ abduction, wand ER in supine, upper trap stretching, scapular retractions. pendulums, supine cane press ups and pullovers, ER PROM using table , wall slides, ER single arm doorway stretch, isotonics, 3 way bicep stretch    Consulted and Agree with Plan of Care  Patient       Patient will benefit from skilled therapeutic intervention in order to improve the following deficits and impairments:  Pain, Improper body mechanics, Postural dysfunction, Decreased strength, Decreased range of motion, Decreased endurance, Decreased activity tolerance, Increased edema  Visit Diagnosis: Chronic right shoulder pain  Muscle weakness (generalized)  Localized edema  Stiffness of right shoulder, not elsewhere classified     Problem List Patient Active Problem List   Diagnosis Date Noted  . Status post arthroscopy of right shoulder 03/04/2017  .  Impingement syndrome of right shoulder 02/05/2017  . Partial tear of right rotator  cuff 02/05/2017  . Chronic right shoulder pain 01/22/2017   Starr Lake PT, DPT, LAT, ATC  05/14/17  9:31 AM      [facility] [address] [place], [place], [address] Phone: [phone]   Fax:  [phone]  Name: Caitlin Price MRN: [id number] Date of Birth: 1950-06-11

## 2017-05-16 ENCOUNTER — Ambulatory Visit: Payer: BLUE CROSS/BLUE SHIELD | Admitting: Physical Therapy

## 2017-05-16 ENCOUNTER — Encounter: Payer: Self-pay | Admitting: Physical Therapy

## 2017-05-16 DIAGNOSIS — R600 Localized edema: Secondary | ICD-10-CM

## 2017-05-16 DIAGNOSIS — M25511 Pain in right shoulder: Secondary | ICD-10-CM | POA: Diagnosis not present

## 2017-05-16 DIAGNOSIS — G8929 Other chronic pain: Secondary | ICD-10-CM

## 2017-05-16 DIAGNOSIS — M6281 Muscle weakness (generalized): Secondary | ICD-10-CM

## 2017-05-16 DIAGNOSIS — M25611 Stiffness of right shoulder, not elsewhere classified: Secondary | ICD-10-CM

## 2017-05-16 NOTE — Therapy (Signed)
[place], [place], [address] Phone: [phone]   Fax:  [phone]  Physical Therapy Treatment / discharge summary  Patient Details  Name: Caitlin Price MRN: [id number] Date of Birth: 1950/07/20 Referring Provider: Zollie Beckers MD   Encounter Date: 05/16/2017  PT End of Session - 05/16/17 0900    Visit Number  15    Number of Visits  17    Date for PT Re-Evaluation  05/21/17    PT Start Time  0848    PT Stop Time  0920    PT Time Calculation (min)  32 min    Activity Tolerance  Patient tolerated treatment well    Behavior During Therapy  [facility] for tasks assessed/performed       Past Medical History:  Diagnosis Date  . Cancer ([place]) 2016   skin cancer- forehead   . Hyperlipidemia   . Hypertension   . Osteopenia     Past Surgical History:  Procedure Laterality Date  . EXCISIONAL HEMORRHOIDECTOMY    . ROTATOR CUFF REPAIR Right 02/21/2017    There were no vitals filed for this visit.  Subjective Assessment - 05/16/17 0851    Subjective  " I was pretty sore yesterday from the exercise but I am doing pretty good today"     Currently in Pain?  No/denies         [facility] PT Assessment - 05/16/17 0902      AROM   Right Shoulder Extension  41 Degrees    Right Shoulder Flexion  140 Degrees    Right Shoulder ABduction  135 Degrees    Right Shoulder Internal Rotation  68 Degrees assessed in 90/90    Right Shoulder External Rotation  60 Degrees assessed in 90/90      Strength   Right Shoulder Flexion  4/5    Right Shoulder ABduction  4/5    Right Shoulder Internal Rotation  4+/5    Right Shoulder External Rotation  4+/5    Right Hand Grip (lbs)  60 55,62,63                   [facility] Adult PT Treatment/Exercise - 05/16/17 0852      Shoulder Exercises: ROM/Strengthening   UBE (Upper Arm Bike)  L2 x 8 min  changing directionat at 4 min      Shoulder Exercises: Stretch   Other  Shoulder Stretches  bicep 3 way 30 sec ea.    Other Shoulder Stretches  doorway streth 2 x 30              PT Education - 05/16/17 0924    Education provided  Yes    Education Details  reviewed previusly provided HEP. benefits of continued exercise and strengtheing to promote/ maximize function with increased reps/ sets.     Person(s) Educated  Patient    Methods  Explanation;Verbal cues;Handout    Comprehension  Verbalized understanding;Verbal cues required       PT Short Term Goals - 05/16/17 0912      PT SHORT TERM GOAL #1   Title  pt to be I with inital HEP     Time  4    Period  Weeks    Status  Achieved      PT SHORT TERM GOAL #2   Title  pt to verbalize/ demo techniques to reduce pain and inflammation via RICE and HEP     Period  Weeks  Status  Achieved      PT SHORT TERM GOAL #3   Title  pt to increase R shoulder active  flexion/ abduction to >/= 100 degrees to promote functional shoulder mobility     Time  4    Period  Weeks    Status  Achieved      PT SHORT TERM GOAL #4   Title  Increase Grip strength on R by >/= 10# to demo improvement in shoulder function     Baseline  4    Period  Weeks    Status  Achieved        PT Long Term Goals - 05/16/17 0913      PT LONG TERM GOAL #1   Title  pt to increaes R shoulder flexion/ abduction to >/= 120 degrees and IR/ER to >/= 60 degrees assessed in 90/90 for functional mobility required for ADLs     Period  Weeks    Status  Achieved      PT LONG TERM GOAL #2   Title  Increase R shoulder strength to >/= 4/5 to promote shoulder stability with activity.     Time  8    Period  Weeks    Status  Achieved      PT LONG TERM GOAL #3   Title  pt to be able to lift/ lower from overhead shelf >/= 10# and push/ pull >/= 15# to for  functional strength/ mobility     Time  8    Period  Weeks    Status  Partially Met      PT LONG TERM GOAL #4   Title  Increase FOTO score to </= 38% limited to demo improvement in  function    Time  8    Period  Weeks    Status  Achieved      PT LONG TERM GOAL #5   Title  pt to be I with all HEP given as of last visit to maintain and progress function     Time  8    Period  Weeks            Plan - 05/16/17 7353    Clinical Impression Statement  Mrs Lattner has made great progress with physical therapy increasing shoulder mobility and strength and additionally is pain free. she met or partially met all goals today. she is able to maintain and progress her current level of function independently and will be discharged from PT today.     PT Next Visit Plan  d/C    PT Home Exercise Plan  table slides flexion/ abduction, wand ER in supine, upper trap stretching, scapular retractions. pendulums, supine cane press ups and pullovers, ER PROM using table , wall slides, ER single arm doorway stretch, isotonics, 3 way bicep stretch    Consulted and Agree with Plan of Care  Patient       Patient will benefit from skilled therapeutic intervention in order to improve the following deficits and impairments:  Pain, Improper body mechanics, Postural dysfunction, Decreased strength, Decreased range of motion, Decreased endurance, Decreased activity tolerance, Increased edema  Visit Diagnosis: Chronic right shoulder pain  Muscle weakness (generalized)  Localized edema  Stiffness of right shoulder, not elsewhere classified     Problem List Patient Active Problem List   Diagnosis Date Noted  . Status post arthroscopy of right shoulder 03/04/2017  . Impingement syndrome of right shoulder 02/05/2017  . Partial tear of right rotator cuff  02/05/2017  . Chronic right shoulder pain 01/22/2017    Starr Lake 05/16/2017, 9:28 AM  [facility] [address] [place], [place], [address] Phone: [phone]   Fax:  [phone]  Name: Caitlin Price MRN: [id number] Date of Birth: 10-01-1950        PHYSICAL  THERAPY DISCHARGE SUMMARY  Visits from Start of Care: 15  Current functional level related to goals / functional outcomes: See goals, FOTO 18% limited   Remaining deficits: Intermittent stiffness with reaching backward. See above assessment   Education / Equipment: HEP, theraband, posture  Plan: Patient agrees to discharge.  Patient goals were met. Patient is being discharged due to being pleased with the current functional level.  ?????         Anniya Whiters PT, DPT, LAT, ATC  05/16/17  9:29 AM

## 2017-05-21 ENCOUNTER — Ambulatory Visit: Payer: BLUE CROSS/BLUE SHIELD | Admitting: Physical Therapy

## 2017-05-23 ENCOUNTER — Ambulatory Visit: Payer: BLUE CROSS/BLUE SHIELD | Admitting: Physical Therapy

## 2017-05-29 ENCOUNTER — Ambulatory Visit (INDEPENDENT_AMBULATORY_CARE_PROVIDER_SITE_OTHER): Payer: BLUE CROSS/BLUE SHIELD | Admitting: Orthopaedic Surgery

## 2017-05-29 ENCOUNTER — Encounter (INDEPENDENT_AMBULATORY_CARE_PROVIDER_SITE_OTHER): Payer: Self-pay | Admitting: Orthopaedic Surgery

## 2017-05-29 DIAGNOSIS — Z9889 Other specified postprocedural states: Secondary | ICD-10-CM

## 2017-05-29 NOTE — Progress Notes (Signed)
The patient is now 3 months status post a right shoulder arthroscopy with extensive subacromial decompression.  She is been going through extensive physical therapy as well.  She is finally doing better overall.  She is been having some burning and stinging sensations but does are still present but of decreased significantly.  She never took the gabapentin we ordered because she said the pharmacist said it would make her feel "funny".  On exam her range of motion of her right shoulder is improved dramatically.  Her pain is decreased significantly.  At this point she can take the gabapentin at night just to help with the residual stinging and burning sensation if she wants to.  I explained  to her how I thought this would help her.  All questions concerns were answered and addressed.  At this point follow-up will be as needed.  She can increase her activities as comfort allows with that right shoulder.

## 2017-07-30 ENCOUNTER — Encounter: Payer: Self-pay | Admitting: Anesthesiology

## 2017-08-01 ENCOUNTER — Encounter: Payer: Self-pay | Admitting: Podiatry

## 2017-08-02 ENCOUNTER — Encounter: Payer: Self-pay | Admitting: Obstetrics & Gynecology

## 2017-08-05 ENCOUNTER — Other Ambulatory Visit: Payer: Self-pay | Admitting: Podiatry

## 2017-08-05 ENCOUNTER — Ambulatory Visit (INDEPENDENT_AMBULATORY_CARE_PROVIDER_SITE_OTHER): Payer: Medicare Other

## 2017-08-05 ENCOUNTER — Encounter: Payer: Self-pay | Admitting: Podiatry

## 2017-08-05 ENCOUNTER — Ambulatory Visit: Payer: Medicare Other | Admitting: Podiatry

## 2017-08-05 DIAGNOSIS — M79671 Pain in right foot: Secondary | ICD-10-CM | POA: Diagnosis not present

## 2017-08-05 DIAGNOSIS — M722 Plantar fascial fibromatosis: Secondary | ICD-10-CM | POA: Diagnosis not present

## 2017-08-05 MED ORDER — TRIAMCINOLONE ACETONIDE 10 MG/ML IJ SUSP: 10.0000 mg | Freq: Once | INTRAMUSCULAR | Status: DC

## 2017-08-05 NOTE — Patient Instructions (Signed)

## 2017-08-05 NOTE — Progress Notes (Signed)
   Subjective:    Patient ID: Caitlin Price, female    DOB: 1950/07/11, 67 y.o.   MRN: [id number]  HPI    Review of Systems  All other systems reviewed and are negative.      Objective:   Physical Exam        Assessment & Plan:

## 2017-08-06 ENCOUNTER — Encounter: Payer: Self-pay | Admitting: Anesthesiology

## 2017-08-06 NOTE — Progress Notes (Signed)
Subjective:   Patient ID: Caitlin Price, female   DOB: 67 y.o.   MRN: [id number]   HPI Patient presents with pain in the right heel of approximate 1 month duration and states is bad when she walks or sits and she needs to be active with her diabetes.  Patient does not smoke likes to be active   Review of Systems  All other systems reviewed and are negative.       Objective:  Physical Exam  Constitutional: She appears well-developed and well-nourished.  Cardiovascular: Intact distal pulses.  Pulmonary/Chest: Effort normal.  Musculoskeletal: Normal range of motion.  Neurological: She is alert.  Skin: Skin is warm.  Nursing note and vitals reviewed.   Neurovascular status intact muscle strength adequate range of motion within normal limits with acute discomfort plantar aspect right heel at the insertional point tendon into the calcaneus.  Patient has good digital perfusion well oriented x3     Assessment:  Acute plantar fasciitis right with inflammation fluid of the medial band     Plan:  H&P x-ray reviewed and today I injected the plantar fascia right 3 mg Kenalog 5 mg Xylocaine applied fascial brace instructed on supportive shoes physical therapy and reappoint to recheck 3 weeks  X-ray indicates small plantar spur with no indication of stress fracture arthritis

## 2017-08-07 ENCOUNTER — Telehealth: Payer: Self-pay

## 2017-08-07 NOTE — Telephone Encounter (Signed)
Patient said she has been on Fosamax for 11 years. Dr. Deforest Hoyles put her on it years ago. She asked if it is still okay to continue after being on it that long.

## 2017-08-07 NOTE — Telephone Encounter (Signed)
-----   Message from Princess Bruins, MD sent at 08/07/2017  3:08 PM EDT ----- BD stable Osteopenia.  Reassure patient.  Vit D supplements, Ca++ rich nutrition, weight bearing physical activity.

## 2017-08-08 NOTE — Telephone Encounter (Signed)
BD stable Osteopenia.  Needs to stop Fosamax now.  Will recheck BD in 2 years.  Vit D supplements, Ca++ rich nutrition, regular weight bearing physical activity.

## 2017-08-08 NOTE — Telephone Encounter (Signed)
Spoke with patient and informed her. °

## 2017-08-19 ENCOUNTER — Ambulatory Visit: Payer: Medicare Other | Admitting: Podiatry

## 2017-08-28 ENCOUNTER — Ambulatory Visit: Payer: Medicare Other | Admitting: Podiatry

## 2017-09-16 NOTE — Progress Notes (Signed)
This encounter was created in error - please disregard.

## 2017-11-13 ENCOUNTER — Encounter: Payer: Self-pay | Admitting: Podiatry

## 2017-11-13 ENCOUNTER — Ambulatory Visit: Payer: Medicare Other | Admitting: Podiatry

## 2017-11-13 DIAGNOSIS — M722 Plantar fascial fibromatosis: Secondary | ICD-10-CM

## 2017-11-13 MED ORDER — TRIAMCINOLONE ACETONIDE 10 MG/ML IJ SUSP
10.0000 mg | Freq: Once | INTRAMUSCULAR | Status: AC
  Administered 2017-11-13: 10 mg

## 2017-11-13 NOTE — Patient Instructions (Signed)

## 2017-11-17 NOTE — Progress Notes (Signed)
Subjective:   Patient ID: Caitlin Price, female   DOB: 67 y.o.   MRN: [id number]   HPI Patient states she is had significant reoccurrence of pain in the plantar aspect of the right heel and states she was doing well but the pain feels worse now and it hurts at all times when she is ambulating   ROS      Objective:  Physical Exam  Neurovascular status intact with continued discomfort that has become more acute plantar aspect of the right heel with mild discomfort in the calcaneus itself.  Patient did have good digital perfusion and is well oriented     Assessment:  Acute plantar fasciitis right with the possibility for low-grade stress fracture but it appears to be more of an acute tendinitis     Plan:  Due to the increasing intenseness of discomfort I have recommended immobilization and applied air fracture walker.  I did do a careful plantar injection to try to reduce the inflammatory process of the fascia 3 mg Kenalog 5 mg Xylocaine advised on reduced activity wearing the boot for the next 3 weeks and reappoint in 1 month

## 2017-11-20 ENCOUNTER — Encounter: Payer: Self-pay | Admitting: Obstetrics & Gynecology

## 2017-11-20 ENCOUNTER — Ambulatory Visit: Payer: Medicare Other | Admitting: Obstetrics & Gynecology

## 2017-11-20 VITALS — BP 132/70 | Ht 59.0 in | Wt 138.0 lb

## 2017-11-20 DIAGNOSIS — Z780 Asymptomatic menopausal state: Secondary | ICD-10-CM

## 2017-11-20 DIAGNOSIS — Z1151 Encounter for screening for human papillomavirus (HPV): Secondary | ICD-10-CM | POA: Diagnosis not present

## 2017-11-20 DIAGNOSIS — Z01419 Encounter for gynecological examination (general) (routine) without abnormal findings: Secondary | ICD-10-CM | POA: Diagnosis not present

## 2017-11-20 DIAGNOSIS — M8589 Other specified disorders of bone density and structure, multiple sites: Secondary | ICD-10-CM

## 2017-11-20 NOTE — Addendum Note (Signed)
Addended by: Thurnell Garbe A on: 11/20/2017 11:12 AM   Modules accepted: Orders

## 2017-11-20 NOTE — Progress Notes (Signed)
Caitlin Price 1950-07-09 [id number]   History:    67 y.o. G0 Single.  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  RP:  Established patient presenting for annual gyn exam   HPI: Postmenopausal, well on no hormone replacement therapy.  No postmenopausal bleeding.  No pelvic pain.  Abstinent.  Urine and bowel movements normal.  Breasts normal.  Body mass index 27.87.  Not regularly physically active.  Diabetes mellitus under treatment with family physician.  Health labs with family physician.  Colonoscopy in 2016.  Past medical history,surgical history, family history and social history were all reviewed and documented in the EPIC chart.  Gynecologic History No LMP recorded. Patient is postmenopausal. Contraception: abstinence and post menopausal status Last Pap: 08/2015. Results were: normal Last mammogram: 7-08/2017. Results were: Dx mammo/US Benign Bone Density: Stable Osteopenia T-Score -1.4 in 07/2017.  D/ced Fosamax after 11 years Colonoscopy: 2016  Obstetric History OB History  Gravida Para Term Preterm AB Living  0 0 0 0 0 0  SAB TAB Ectopic Multiple Live Births  0 0 0 0 0     ROS: A ROS was performed and pertinent positives and negatives are included in the history.  GENERAL: No fevers or chills. HEENT: No change in vision, no earache, sore throat or sinus congestion. NECK: No pain or stiffness. CARDIOVASCULAR: No chest pain or pressure. No palpitations. PULMONARY: No shortness of breath, cough or wheeze. GASTROINTESTINAL: No abdominal pain, nausea, vomiting or diarrhea, melena or bright red blood per rectum. GENITOURINARY: No urinary frequency, urgency, hesitancy or dysuria. MUSCULOSKELETAL: No joint or muscle pain, no back pain, no recent trauma. DERMATOLOGIC: No rash, no itching, no lesions. ENDOCRINE: No polyuria, polydipsia, no heat or cold intolerance. No recent change in weight. HEMATOLOGICAL: No anemia or easy bruising or bleeding. NEUROLOGIC: No  headache, seizures, numbness, tingling or weakness. PSYCHIATRIC: No depression, no loss of interest in normal activity or change in sleep pattern.     Exam:   BP 132/70   Ht 4\' 11"  (1.499 m)   Wt 138 lb (62.6 kg)   BMI 27.87 kg/m   Body mass index is 27.87 kg/m.  General appearance : Well developed well nourished female. No acute distress HEENT: Eyes: no retinal hemorrhage or exudates,  Neck supple, trachea midline, no carotid bruits, no thyroidmegaly Lungs: Clear to auscultation, no rhonchi or wheezes, or rib retractions  Heart: Regular rate and rhythm, no murmurs or gallops Breast:Examined in sitting and supine position were symmetrical in appearance, no palpable masses or tenderness,  no skin retraction, no nipple inversion, no nipple discharge, no skin discoloration, no axillary or supraclavicular lymphadenopathy Abdomen: no palpable masses or tenderness, no rebound or guarding Extremities: no edema or skin discoloration or tenderness  Pelvic: Vulva: Normal             Vagina: No gross lesions or discharge  Cervix: No gross lesions or discharge.  Pap/HPV HR done.  Uterus  AV, normal size, shape and consistency, non-tender and mobile  Adnexa  Without masses or tenderness  Anus: Normal   Assessment/Plan:  67 y.o. female for annual exam   1. Encounter for routine gynecological examination with Papanicolaou smear of cervix Normal gynecologic exam and menopause.  Pap test with high risk HPV done today.  Breast exam normal.  Next screening mammogram in July 2020.  Colonoscopy in 2016.  Health labs with family physician.  Body mass index 27.87.  Recommend healthy diet with plenty of  vegetables and to continue with calcium rich foods.  I aerobic physical activities 5 times a week and weightlifting every 2 days.  2. Postmenopausal Well on no hormone replacement therapy.  No postmenopausal bleeding.  3. Osteopenia of multiple sites Bone density in July 2019 showed stable osteopenia  with the lowest T score at -1.4.  Patient had been on Fosamax for 11 years, therefore recommended to stop which she did.  Vitamin D supplements to continue with calcium intake of 1.5 g a day including supplements and food, regular weightbearing physical activity.  Will repeat bone density in July 2021.  Princess Bruins MD, 10:11 AM 11/20/2017

## 2017-11-20 NOTE — Patient Instructions (Signed)
1. Encounter for routine gynecological examination with Papanicolaou smear of cervix Normal gynecologic exam and menopause.  Pap test with high risk HPV done today.  Breast exam normal.  Next screening mammogram in July 2020.  Colonoscopy in 2016.  Health labs with family physician.  Body mass index 27.87.  Recommend healthy diet with plenty of vegetables and to continue with calcium rich foods.  I aerobic physical activities 5 times a week and weightlifting every 2 days.  2. Postmenopausal Well on no hormone replacement therapy.  No postmenopausal bleeding.  3. Osteopenia of multiple sites Bone density in July 2019 showed stable osteopenia with the lowest T score at -1.4.  Patient had been on Fosamax for 11 years, therefore recommended to stop which she did.  Vitamin D supplements to continue with calcium intake of 1.5 g a day including supplements and food, regular weightbearing physical activity.  Will repeat bone density in July 2021.  Kattleya, it was a pleasure seeing you today!  I will inform you of your results as soon as they are available.

## 2017-11-21 LAB — PAP, TP IMAGING W/ HPV RNA, RFLX HPV TYPE 16,18/45: HPV DNA HIGH RISK: NOT DETECTED

## 2017-12-11 ENCOUNTER — Ambulatory Visit: Payer: Medicare Other | Admitting: Podiatry

## 2017-12-23 ENCOUNTER — Ambulatory Visit: Payer: Medicare Other | Admitting: Podiatry

## 2018-03-04 ENCOUNTER — Encounter (INDEPENDENT_AMBULATORY_CARE_PROVIDER_SITE_OTHER): Payer: Self-pay | Admitting: Physician Assistant

## 2018-03-04 ENCOUNTER — Telehealth (INDEPENDENT_AMBULATORY_CARE_PROVIDER_SITE_OTHER): Payer: Self-pay | Admitting: Physician Assistant

## 2018-03-04 ENCOUNTER — Ambulatory Visit (INDEPENDENT_AMBULATORY_CARE_PROVIDER_SITE_OTHER): Payer: Medicare Other | Admitting: Physician Assistant

## 2018-03-04 ENCOUNTER — Ambulatory Visit (INDEPENDENT_AMBULATORY_CARE_PROVIDER_SITE_OTHER): Payer: Medicare Other

## 2018-03-04 DIAGNOSIS — M25561 Pain in right knee: Secondary | ICD-10-CM | POA: Diagnosis not present

## 2018-03-04 MED ORDER — METHYLPREDNISOLONE ACETATE 40 MG/ML IJ SUSP
40.0000 mg | INTRAMUSCULAR | Status: AC | PRN
  Administered 2018-03-04: 40 mg via INTRA_ARTICULAR

## 2018-03-04 MED ORDER — LIDOCAINE HCL 1 % IJ SOLN
3.0000 mL | INTRAMUSCULAR | Status: AC | PRN
Start: 2018-03-04 — End: 2018-03-04
  Administered 2018-03-04: 3 mL

## 2018-03-04 NOTE — Telephone Encounter (Signed)
You saw patient this morning. Please advise. Thanks.

## 2018-03-04 NOTE — Telephone Encounter (Signed)
All the fluid was aspirated.

## 2018-03-04 NOTE — Telephone Encounter (Signed)
Patient called stated that Artis Delay drew fluid off knee and wabted hin to know it was a welt on back of that knee as well. She thinks it may be more fluid there,  Please call patient to advise.  [phone]

## 2018-03-04 NOTE — Progress Notes (Signed)
Office Visit Note   Patient: Caitlin Price           Date of Birth: 1950/03/07           MRN: [id number] Visit Date: 03/04/2018              Requested by: Wenda Low, MD [address] [place], [place] [address] PCP: Wenda Low, MD   Assessment & Plan: Visit Diagnoses:  1. Right knee pain, unspecified chronicity     Plan: She will begin taking Aleve 2 tablets in the morning 2 tablets in the evening with food stop her ibuprofen.  She will work on quad strengthening exercises as shown today.  We will see her back in 2 weeks to see her response to the cortisone injection.  She is to be mindful of her glucose levels over the next 2 days.  She is given an Ace wrap which was applied to the knee today she is leaving until this evening and then take it off in the evening she can wear during the day if she finds it beneficial.  Questions encouraged and answered at length.  Follow-Up Instructions: Return in about 2 weeks (around 03/18/2018).   Orders:  Orders Placed This Encounter  Procedures  . Large Joint Inj  . XR KNEE 3 VIEW RIGHT   No orders of the defined types were placed in this encounter.     Procedures: Large Joint Inj: R knee on 03/04/2018 8:41 AM Indications: pain Details: 22 G 1.5 in needle, anterolateral approach  Arthrogram: No  Medications: 3 mL lidocaine 1 %; 40 mg methylPREDNISolone acetate 40 MG/ML Aspirate: 25 mL yellow Outcome: tolerated well, no immediate complications Procedure, treatment alternatives, risks and benefits explained, specific risks discussed. Consent was given by the patient. Immediately prior to procedure a time out was called to verify the correct patient, procedure, equipment, support staff and site/side marked as required. Patient was prepped and draped in the usual sterile fashion.       Clinical Data: No additional findings.   Subjective: Chief Complaint  Patient presents with  . Right Knee - Pain    HPI    Caitlin Price 68 year old female well-known to Dr. Ninfa Linden service comes in today with right knee pain is been ongoing for the past week.  She does have a history of swelling of the knee 3 to 4 years ago was seen by an orthopedic surgeon here in [place] MRI was done that showed some mild patellofemoral changes and some mild medial compartmental changes degeneration of the medial meniscus but no frank tear.  She is been doing well until a week ago she was walking her dog the dog gave out heart pull she has felt excruciating pain in the posterior aspect of the knee.  Since then she has had pain in the knee.  She is tried some ibuprofen without any real relief tried IcyHot also for the knee with no relief.  She denies any mechanical symptoms.  She did have some popping but it is not painful.  Review of Systems No fevers chills.  Please see HPI otherwise negative  Objective: Vital Signs: There were no vitals taken for this visit.  Physical Exam Constitutional:      Appearance: She is not ill-appearing or diaphoretic.  Pulmonary:     Effort: Pulmonary effort is normal.  Neurological:     Mental Status: She is alert and oriented to person, place, and time.  Psychiatric:  Mood and Affect: Mood normal.     Ortho Exam Right knee full extension flexion to approximately 105 degrees limited by pain and fullness.  No instability valgus varus stressing of either knee.  Anterior drawer right knee negative.  McMurray's positive on the right negative on the left.  Tenderness posterior medial joint line right knee only.  Right knee no abnormal warmth or erythema.  Positive right knee effusion.  Mild calf supple nontender.  Walks with antalgic gait without assistive device. Specialty Comments:  No specialty comments available.  Imaging: Xr Knee 3 View Right  Result Date: 03/04/2018 Right knee AP lateral sunrise view: No acute fracture.  Knee is well located.  Mild patellofemoral changes.  Medial  lateral compartments are appear well preserved on the radiograph.  No bony abnormalities otherwise.  Positive knee effusion.    PMFS History: Patient Active Problem List   Diagnosis Date Noted  . Status post arthroscopy of right shoulder 03/04/2017  . Impingement syndrome of right shoulder 02/05/2017  . Partial tear of right rotator cuff 02/05/2017  . Chronic right shoulder pain 01/22/2017   Past Medical History:  Diagnosis Date  . Cancer ([place]) 2016   skin cancer- forehead   . Hyperlipidemia   . Hypertension   . Osteopenia     Family History  Problem Relation Age of Onset  . Colon cancer Maternal Uncle   . Heart attack Mother   . Heart attack Father   . Diabetes Sister   . Diabetes Maternal Grandfather   . Diabetes Paternal Grandmother     Past Surgical History:  Procedure Laterality Date  . EXCISIONAL HEMORRHOIDECTOMY    . ROTATOR CUFF REPAIR Right 02/21/2017   Social History   Occupational History  . Not on file  Tobacco Use  . Smoking status: Never Smoker  . Smokeless tobacco: Never Used  Substance and Sexual Activity  . Alcohol use: No    Alcohol/week: 0.0 standard drinks  . Drug use: No  . Sexual activity: Not Currently    Comment: 1st intercourse- 22, partners-  1,

## 2018-03-04 NOTE — Telephone Encounter (Signed)
IC advised.  

## 2018-03-18 ENCOUNTER — Encounter (INDEPENDENT_AMBULATORY_CARE_PROVIDER_SITE_OTHER): Payer: Self-pay | Admitting: Orthopaedic Surgery

## 2018-03-18 ENCOUNTER — Ambulatory Visit (INDEPENDENT_AMBULATORY_CARE_PROVIDER_SITE_OTHER): Payer: Medicare Other | Admitting: Orthopaedic Surgery

## 2018-03-18 DIAGNOSIS — M25561 Pain in right knee: Secondary | ICD-10-CM

## 2018-03-18 NOTE — Progress Notes (Signed)
HPI: [name] returns today follow-up of her right knee.  She states overall the knee is better than it was.  She no longer is having problems when are trying to sleep at night and knees touching.  She is using no assistive device.  She denies any mechanical symptoms in the knee.  She does note some swelling posterior aspect of the knee.  She is unable to take the Aleve and is gone back to ibuprofen.  Review of systems see HPI otherwise negative or noncontributory.  Physical exam: Right knee full extension full flexion.  Negative McMurray's.  No instability valgus varus stressing.  Very mild effusion no abnormal warmth.  Popliteal area with edema consistent with Baker's cyst.  Calf supple nontender.  Slight tenderness along the anterior medial joint line.  Impression: Right knee pain  Plan: She will go back to taking her ibuprofen as needed.  Ace wrap or knee brace as needed.  Symptoms fail to improve or if she develops any mechanical symptoms which are reviewed with her at length today she will call our office and we can obtain an MRI.  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  She will otherwise follow-up with [place] on as-needed basis.

## 2018-03-28 ENCOUNTER — Telehealth (INDEPENDENT_AMBULATORY_CARE_PROVIDER_SITE_OTHER): Payer: Self-pay

## 2018-03-28 NOTE — Telephone Encounter (Signed)
Called patient and asked the screening questions.  Do you have now or have you had in the past 7 days a fever and/or chills? NO  Do you have now or have you had in the past 7 days a cough? NO  Do you have now or have you had in the last 7 days nausea, vomiting or abdominal pain? NO  Have you been exposed to anyone who has tested positive for COVID-19? NO  Have you or anyone who lives with you traveled within the last month? NO 

## 2018-03-31 ENCOUNTER — Ambulatory Visit (INDEPENDENT_AMBULATORY_CARE_PROVIDER_SITE_OTHER): Payer: Medicare Other | Admitting: Orthopaedic Surgery

## 2018-03-31 ENCOUNTER — Encounter (INDEPENDENT_AMBULATORY_CARE_PROVIDER_SITE_OTHER): Payer: Self-pay | Admitting: Orthopaedic Surgery

## 2018-03-31 ENCOUNTER — Other Ambulatory Visit (INDEPENDENT_AMBULATORY_CARE_PROVIDER_SITE_OTHER): Payer: Self-pay

## 2018-03-31 ENCOUNTER — Other Ambulatory Visit: Payer: Self-pay

## 2018-03-31 VITALS — Ht 60.0 in | Wt 140.0 lb

## 2018-03-31 DIAGNOSIS — M25561 Pain in right knee: Principal | ICD-10-CM

## 2018-03-31 DIAGNOSIS — G8929 Other chronic pain: Secondary | ICD-10-CM

## 2018-03-31 NOTE — Progress Notes (Signed)
The patient is here today for continued follow-up dealing with acute right knee pain after injury that occurred in February of this year.  She had a large effusion of her knee that was fully drained and placed a steroid injection in her knee.  She still having problems with swelling on the medial aspect of her right knee and it hurts with pivoting activities.  There is been some locking and catching now.  She actually had an MRI of this knee back in 2016 that showed some partial-thickness cartilage loss in the medial compartment of her knee.  Her x-rays just recently of that right knee showed still a well-maintained joint space.  On examination of her right knee she has medial joint line tenderness and a slight effusion.  She has a positive Murray sign to the medial compartment.  This point an MRI is warranted to assess the furthering of potential cartilage loss of her right knee as well as assess for meniscal tear given the acute injury just the last month with continued locking catching and recurrent effusion.  I suspect she may have an acute meniscal tear based on her exam and x-ray findings.  She is now failed conservative treatment so this is warranted at this standpoint.  As soon as she gets that study she will call us back for a return appointment to go over the MRI.

## 2018-04-02 ENCOUNTER — Telehealth (INDEPENDENT_AMBULATORY_CARE_PROVIDER_SITE_OTHER): Payer: Self-pay | Admitting: *Deleted

## 2018-04-02 NOTE — Telephone Encounter (Signed)
Let's get her back in next week to see her knee and drain it/inject it

## 2018-04-02 NOTE — Telephone Encounter (Signed)
Pt states she was in couple days ago in reference to her Right knee and Dr. Ninfa Linden is going to order MRI of R knee but states she failed to tell him about the pain behind her knee and now says it is swollen and feels like it has fluid in it.   I advised pt Dr. Ninfa Linden had put order in for MRI but the imaging facilities will not schedule for 6 weeks so she is wanting to know what needs to be done? Wants to know if CB can drain the fluid out. I advised she would need to make appt but she says he told her not to make appt until she has her MRI.   Please advise.   [phone]

## 2018-04-03 NOTE — Telephone Encounter (Signed)
Can we schedule her next week with Ninfa Linden please?

## 2018-04-03 NOTE — Telephone Encounter (Signed)
Patient is scheduled 04/07/2018 at 8:45am with Dr. Ninfa Linden

## 2018-04-04 ENCOUNTER — Telehealth (INDEPENDENT_AMBULATORY_CARE_PROVIDER_SITE_OTHER): Payer: Self-pay

## 2018-04-04 NOTE — Telephone Encounter (Signed)
Called and lm on vm to call back. Pt has an appt with Dr. Ninfa Linden on Monday call to ask pre screen questions COVID-19

## 2018-04-07 ENCOUNTER — Ambulatory Visit (INDEPENDENT_AMBULATORY_CARE_PROVIDER_SITE_OTHER): Payer: Medicare Other | Admitting: Orthopaedic Surgery

## 2018-04-11 ENCOUNTER — Telehealth (INDEPENDENT_AMBULATORY_CARE_PROVIDER_SITE_OTHER): Payer: Self-pay

## 2018-04-11 NOTE — Telephone Encounter (Signed)
patient called back and I asked the screening questions.  Do you have now or have you had in the past 7 days a fever and/or chills? NO  Do you have now or have you had in the past 7 days a cough? NO  Do you have now or have you had in the last 7 days nausea, vomiting or abdominal pain? NO  Have you been exposed to anyone who has tested positive for COVID-19? NO  Have you or anyone who lives with you traveled within the last month? NO

## 2018-04-11 NOTE — Telephone Encounter (Signed)
Called patient. No answer LMOM to return our call. If she calls back please ask  the screening questions.  Do you have now or have you had in the past 7 days a fever and/or chills?   Do you have now or have you had in the past 7 days a cough?  Do you have now or have you had in the last 7 days nausea, vomiting or abdominal pain?   Have you been exposed to anyone who has tested positive for COVID-19?  Have you or anyone who lives with you traveled within the last month?

## 2018-04-14 ENCOUNTER — Encounter (INDEPENDENT_AMBULATORY_CARE_PROVIDER_SITE_OTHER): Payer: Self-pay | Admitting: Orthopaedic Surgery

## 2018-04-14 ENCOUNTER — Ambulatory Visit (INDEPENDENT_AMBULATORY_CARE_PROVIDER_SITE_OTHER): Payer: Medicare Other | Admitting: Orthopaedic Surgery

## 2018-04-14 ENCOUNTER — Other Ambulatory Visit: Payer: Self-pay

## 2018-04-14 ENCOUNTER — Ambulatory Visit (INDEPENDENT_AMBULATORY_CARE_PROVIDER_SITE_OTHER): Payer: Medicare Other

## 2018-04-14 DIAGNOSIS — M25561 Pain in right knee: Secondary | ICD-10-CM | POA: Diagnosis not present

## 2018-04-14 DIAGNOSIS — G8929 Other chronic pain: Secondary | ICD-10-CM

## 2018-04-14 DIAGNOSIS — M25511 Pain in right shoulder: Secondary | ICD-10-CM

## 2018-04-14 MED ORDER — METHYLPREDNISOLONE ACETATE 40 MG/ML IJ SUSP
40.0000 mg | INTRAMUSCULAR | Status: AC | PRN
  Administered 2018-04-14: 40 mg via INTRA_ARTICULAR

## 2018-04-14 MED ORDER — LIDOCAINE HCL 1 % IJ SOLN
3.0000 mL | INTRAMUSCULAR | Status: AC | PRN
  Administered 2018-04-14: 3 mL

## 2018-04-14 NOTE — Progress Notes (Signed)
Office Visit Note   Patient: Caitlin Price           Date of Birth: 25-Aug-1950           MRN: [id number] Visit Date: 04/14/2018              Requested by: Wenda Low, MD [address] [place], [place] [address] PCP: Wenda Low, MD   Assessment & Plan: Visit Diagnoses:  1. Chronic right shoulder pain   2. Right knee pain, unspecified chronicity     Plan: We will have her follow-up once that she is obtain the MRI of the right knee.  Questions were encouraged and answered in regards to her knee and her shoulder today.  She also had questions about plantar fasciitis and has shown some stretching activities for her plantar fascia particular gastrocsoleus.  Follow-Up Instructions: Return After MRI.   Orders:  Orders Placed This Encounter  Procedures  . Large Joint Inj  . XR Shoulder Right   No orders of the defined types were placed in this encounter.     Procedures: Large Joint Inj: R subacromial bursa on 04/14/2018 9:49 AM Indications: pain Details: 22 G 1.5 in needle, superior approach  Arthrogram: No  Medications: 3 mL lidocaine 1 %; 40 mg methylPREDNISolone acetate 40 MG/ML Outcome: tolerated well, no immediate complications Procedure, treatment alternatives, risks and benefits explained, specific risks discussed. Consent was given by the patient. Immediately prior to procedure a time out was called to verify the correct patient, procedure, equipment, support staff and site/side marked as required. Patient was prepped and draped in the usual sterile fashion.       Clinical Data: No additional findings.   Subjective: Chief Complaint  Patient presents with  . Right Knee - Pain    HPI Caitlin Price comes in today worried about her right knee.  She did have MRI ordered of the knee however due to the COVID-19 virus this may not be done until sometime in May.  Still having swelling posterior aspect of the knee.  She is also having some pain in  her right shoulder which she had a rotator cuff repair of more than a year ago.  She is had no fall or injury to that shoulder just became sore after using crutches due to her knee.  Review of Systems See HPI  Objective: Vital Signs: There were no vitals taken for this visit.  Physical Exam Constitutional:      Appearance: She is not ill-appearing or diaphoretic.  Pulmonary:     Effort: Pulmonary effort is normal.  Neurological:     Mental Status: She is alert and oriented to person, place, and time.  Psychiatric:        Behavior: Behavior normal.     Ortho Exam Right knee full extension flexion to approximately 110 degrees.  She has fullness posterior aspect of the knee but no effusion.  No instability.  Slight tenderness along the medial and lateral joint lines. Right shoulder full range of motion.  Out of 5 strength with external and internal rotation against resistance negative liftoff test.  Empty can test negative.  Slightly positive impingement testing.  Nontender along medial border scapula right shoulder. Specialty Comments:  No specialty comments available.  Imaging: Xr Shoulder Right  Result Date: 04/14/2018 Right shoulder 3 views: No acute fracture.  Shoulders well located.  Glenohumeral joint is well-maintained.  Greater tuberosity region with sclerotic activity consistent with rotator cuff repair.  PMFS History: Patient Active Problem List   Diagnosis Date Noted  . Status post arthroscopy of right shoulder 03/04/2017  . Impingement syndrome of right shoulder 02/05/2017  . Partial tear of right rotator cuff 02/05/2017  . Chronic right shoulder pain 01/22/2017   Past Medical History:  Diagnosis Date  . Cancer ([place]) 2016   skin cancer- forehead   . Hyperlipidemia   . Hypertension   . Osteopenia     Family History  Problem Relation Age of Onset  . Colon cancer Maternal Uncle   . Heart attack Mother   . Heart attack Father   . Diabetes Sister   .  Diabetes Maternal Grandfather   . Diabetes Paternal Grandmother     Past Surgical History:  Procedure Laterality Date  . EXCISIONAL HEMORRHOIDECTOMY    . ROTATOR CUFF REPAIR Right 02/21/2017   Social History   Occupational History  . Not on file  Tobacco Use  . Smoking status: Never Smoker  . Smokeless tobacco: Never Used  Substance and Sexual Activity  . Alcohol use: No    Alcohol/week: 0.0 standard drinks  . Drug use: No  . Sexual activity: Not Currently    Comment: 1st intercourse- 22, partners-  1,

## 2018-05-14 ENCOUNTER — Other Ambulatory Visit: Payer: Self-pay

## 2018-05-14 ENCOUNTER — Ambulatory Visit: Payer: Medicare Other | Admitting: Podiatry

## 2018-05-14 ENCOUNTER — Encounter: Payer: Self-pay | Admitting: Podiatry

## 2018-05-14 DIAGNOSIS — M722 Plantar fascial fibromatosis: Secondary | ICD-10-CM

## 2018-05-14 MED ORDER — TRIAMCINOLONE ACETONIDE 10 MG/ML IJ SUSP
10.0000 mg | Freq: Once | INTRAMUSCULAR | Status: AC
  Administered 2018-05-14: 10 mg

## 2018-05-14 NOTE — Progress Notes (Signed)
Subjective:   Patient ID: Caitlin Price, female   DOB: 68 y.o.   MRN: [id number]   HPI Patient presents stating the boot is hard for her to wear but she feels like she needs something but it is very happy and she continues to have a lot of pain underneath her right heel and is having a lot of problems currently with her right knee and is due to get MRI done   ROS      Objective:  Physical Exam  Neurovascular status found to be intact with severe discomfort plantar aspect right heel in the mid heel and medial side of the heel with inflammation fluid buildup and moderate depression of the arch     Assessment:  Acute plantar fasciitis right with inflammation fluid buildup and knee problems which may be secondary to the foot problems     Plan:  H&P conditions reviewed education rendered to patient.  Today I went ahead and placed patient into a night splint to support along with heat ice therapy and stretching.  I then went ahead and reinjected the plantar fascia 3 mg Kenalog 5 mg Xylocaine and gave instructions for physical therapy.  Reappoint for [place] to recheck

## 2018-06-12 ENCOUNTER — Ambulatory Visit
Admission: RE | Admit: 2018-06-12 | Discharge: 2018-06-12 | Disposition: A | Discharge status: Home / Self Care | Payer: Medicare Other | Source: Ambulatory Visit | Attending: Orthopaedic Surgery | Admitting: Orthopaedic Surgery

## 2018-06-12 ENCOUNTER — Other Ambulatory Visit: Payer: Self-pay

## 2018-06-12 DIAGNOSIS — G8929 Other chronic pain: Secondary | ICD-10-CM

## 2018-06-12 DIAGNOSIS — M25561 Pain in right knee: Secondary | ICD-10-CM

## 2018-06-16 ENCOUNTER — Encounter: Payer: Self-pay | Admitting: Orthopaedic Surgery

## 2018-06-16 ENCOUNTER — Ambulatory Visit (INDEPENDENT_AMBULATORY_CARE_PROVIDER_SITE_OTHER): Payer: Medicare Other | Admitting: Orthopaedic Surgery

## 2018-06-16 ENCOUNTER — Other Ambulatory Visit: Payer: Self-pay

## 2018-06-16 DIAGNOSIS — S83241A Other tear of medial meniscus, current injury, right knee, initial encounter: Secondary | ICD-10-CM | POA: Diagnosis not present

## 2018-06-16 NOTE — Progress Notes (Signed)
The patient is coming in to review an MRI of her right knee.  She had an acute injury to that knee when she was clipped by a dog and twisted her knee.  She has a lot of pain in the back of the knee into the medial side.  After the failed conservative treatment with activity modification, rest, quad strengthening exercises, offloading her knee, anti-inflammatories and a steroid injection and MRI was obtained.  She is here for review of the MRI of her right knee today.  She reports the same complaints.  On exam she has medial joint line tenderness as well as posterior lateral and posterior medial pain on twisting her to be on her femur.  The MRI does confirm a complex acute posterior horn meniscal root tear of the medial meniscus.  There is only mild cartilage thinning in the knee.  Given the acute nature of meniscal tear as well as mechanical symptoms combined with the failure of conservative treatment we are recommending an arthroscopic intervention.  I showed her knee model explained in detail what arthroscopic surgery involves with the knee.  We discussed the risk and benefits of the surgery as well.  She would like to think about this.  I gave her a copy of her MRI report and our surgery scheduler's card and explained what we would need to do.  She will let us know.

## 2018-07-02 ENCOUNTER — Other Ambulatory Visit: Payer: Self-pay

## 2018-07-02 ENCOUNTER — Ambulatory Visit: Payer: Medicare Other

## 2018-07-02 ENCOUNTER — Encounter: Payer: Self-pay | Admitting: Podiatry

## 2018-07-02 ENCOUNTER — Ambulatory Visit: Payer: Medicare Other | Admitting: Podiatry

## 2018-07-02 VITALS — Temp 98.0°F

## 2018-07-02 DIAGNOSIS — M722 Plantar fascial fibromatosis: Secondary | ICD-10-CM

## 2018-07-06 NOTE — Progress Notes (Signed)
Subjective:   Patient ID: Caitlin Price, female   DOB: 68 y.o.   MRN: [id number]   HPI Patient states doing a lot better with significant diminishment of the discomfort in the plantar heel   ROS      Objective:  Physical Exam  Neurovascular status intact with patient's right heel improving with pain still present but only upon deep palpation     Assessment:  Acute fasciitis right that is improved but still present     Plan:  H&P condition reviewed and advised on physical therapy anti-inflammatory supportive shoe and reappoint if symptoms persist or get worse

## 2018-08-01 ENCOUNTER — Encounter: Payer: Self-pay | Admitting: Obstetrics & Gynecology

## 2018-08-28 ENCOUNTER — Ambulatory Visit: Payer: Medicare Other | Admitting: Podiatry

## 2018-08-28 ENCOUNTER — Ambulatory Visit (INDEPENDENT_AMBULATORY_CARE_PROVIDER_SITE_OTHER): Payer: Medicare Other

## 2018-08-28 ENCOUNTER — Encounter: Payer: Self-pay | Admitting: Podiatry

## 2018-08-28 ENCOUNTER — Other Ambulatory Visit: Payer: Self-pay

## 2018-08-28 VITALS — Temp 97.7°F

## 2018-08-28 DIAGNOSIS — M722 Plantar fascial fibromatosis: Secondary | ICD-10-CM | POA: Diagnosis not present

## 2018-08-28 NOTE — Progress Notes (Signed)
Subjective:   Patient ID: Caitlin Price, female   DOB: 68 y.o.   MRN: [id number]   HPI Patient presents stating the heel has really been bothering her and it feels like a sharp tap on the bottom.  States it bothers her at all time worse when she first get up and after sitting and is become more of an issue for her and points to the direct center bottom of her heel   ROS      Objective:  Physical Exam  Neurovascular status intact muscle strength adequate with patient's right plantar heel quite sore in the medial and central portion of the fascia at the insertion into the calcaneus     Assessment:  Acute plantar fasciitis right with inflammation     Plan:  H&P condition reviewed and I have recommended shockwave therapy and reviewed shockwave therapy with patient.  Patient wants shockwave therapy and I educated her on this and patient is scheduled for shockwave therapy at the current time shows visit

## 2018-08-29 ENCOUNTER — Telehealth: Payer: Self-pay | Admitting: *Deleted

## 2018-08-29 NOTE — Telephone Encounter (Signed)
I called [phone], told her I had received a call from this phone concerning pt. Caitlin Price states she is pt's sister, and pt is wondering if Dr. Paulla Dolly can do injections for her heel pain at her next visit with him, because she had injections before, but she can not afford the shockwave. I told Butch Penny I would ask Dr. Paulla Dolly Monday and call again.

## 2018-08-29 NOTE — Telephone Encounter (Signed)
Female caller states pt was seen 08/28/2018 with questions.

## 2018-09-04 NOTE — Telephone Encounter (Signed)
That would be fine 

## 2018-09-04 NOTE — Telephone Encounter (Signed)
Left message informing pt of Dr. Mellody Drown okay to get the injection on 09/08/2018 at 3:15pm.

## 2018-09-05 ENCOUNTER — Telehealth: Payer: Self-pay

## 2018-09-05 ENCOUNTER — Other Ambulatory Visit: Payer: Medicare Other

## 2018-09-05 NOTE — Telephone Encounter (Signed)
Sister left message wanting to schedule knee scope for patient.  Please provide surgery sheet.

## 2018-09-08 ENCOUNTER — Telehealth: Payer: Self-pay | Admitting: Orthopaedic Surgery

## 2018-09-08 ENCOUNTER — Encounter: Payer: Self-pay | Admitting: Podiatry

## 2018-09-08 ENCOUNTER — Other Ambulatory Visit: Payer: Self-pay

## 2018-09-08 ENCOUNTER — Ambulatory Visit: Payer: Medicare Other | Admitting: Podiatry

## 2018-09-08 DIAGNOSIS — M722 Plantar fascial fibromatosis: Secondary | ICD-10-CM | POA: Diagnosis not present

## 2018-09-08 NOTE — Telephone Encounter (Signed)
Patient sister Butch Penny called trying to hel sister get surgery scheduled. Please call Butch Penny to advise.  [phone]

## 2018-09-08 NOTE — Progress Notes (Signed)
Subjective:   Patient ID: Caitlin Price, female   DOB: 68 y.o.   MRN: [id number]   HPI Patient presents with a lot of pain in the plantar aspect of the right heel and states that she cannot do shockwave right now even though she may have to do at one point in future or surgery and she just wants an injection   ROS      Objective:  Physical Exam  Neurovascular status intact with exquisite discomfort plantar aspect right heel in the mid heel that is very painful when palpated     Assessment:  Acute plantar fasciitis right     Plan:  Sterile prep injected the mid fashion central fascia right 3 mg Kenalog 5 mg Xylocaine and instructed on anti-inflammatories and reappoint to recheck

## 2018-09-08 NOTE — Telephone Encounter (Signed)
Ok. Will do

## 2018-09-09 NOTE — Telephone Encounter (Signed)
I called sister and scheduled.

## 2018-09-11 ENCOUNTER — Other Ambulatory Visit: Payer: Medicare Other

## 2018-09-18 ENCOUNTER — Other Ambulatory Visit: Payer: Medicare Other

## 2018-09-25 ENCOUNTER — Other Ambulatory Visit: Payer: Self-pay | Admitting: Orthopaedic Surgery

## 2018-09-25 DIAGNOSIS — S83231D Complex tear of medial meniscus, current injury, right knee, subsequent encounter: Secondary | ICD-10-CM

## 2018-09-25 MED ORDER — TRAMADOL HCL 50 MG PO TABS: 50.0000 mg | ORAL_TABLET | Freq: Four times a day (QID) | ORAL | 0 refills | Status: DC | PRN

## 2018-10-02 ENCOUNTER — Telehealth: Payer: Self-pay | Admitting: Orthopaedic Surgery

## 2018-10-02 NOTE — Telephone Encounter (Signed)
Patient aware of the below message  

## 2018-10-02 NOTE — Telephone Encounter (Signed)
Patient would like to know how long she needs to wear ace bandage. Her sister called. Would like for someone to call her. [phone]

## 2018-10-02 NOTE — Telephone Encounter (Signed)
She does not need to have Ace bandage on at all at this point.

## 2018-10-02 NOTE — Telephone Encounter (Signed)
Please advise  Right knee scope 09/25/18

## 2018-10-06 ENCOUNTER — Ambulatory Visit (INDEPENDENT_AMBULATORY_CARE_PROVIDER_SITE_OTHER): Payer: Medicare Other | Admitting: Orthopaedic Surgery

## 2018-10-06 ENCOUNTER — Encounter: Payer: Self-pay | Admitting: Orthopaedic Surgery

## 2018-10-06 ENCOUNTER — Telehealth: Payer: Self-pay | Admitting: Orthopaedic Surgery

## 2018-10-06 DIAGNOSIS — Z9889 Other specified postprocedural states: Secondary | ICD-10-CM

## 2018-10-06 NOTE — Telephone Encounter (Signed)
Patient's sister called and wanted to know if she could come into the appointment with her at 3:45 today so that she can help her sister with whatever Dr. Ninfa Linden says to her today.  CB#[phone].  Thank you.

## 2018-10-06 NOTE — Telephone Encounter (Signed)
Yes, that's fine 

## 2018-10-06 NOTE — Progress Notes (Signed)
HPI: [name] returns today 1 week status post right knee arthroscopy with partial medial meniscectomy.  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 She has had no shortness of breath fevers chills.  Physical exam right knee port sites well approximated with nylon sutures no signs of infection.  Calf supple nontender.  Good range of motion of the right knee.   Impression status post right knee arthroscopy with partial medial meniscectomy 09/25/2018  Plan: Discussed with her quad strengthening exercises.  We will remove sutures.  She is to work on range of motion strengthening the knee.  She may benefit from a supplemental injection in the future.  Should follow-up with [place] in 1 month.

## 2018-10-27 ENCOUNTER — Encounter: Payer: Self-pay | Admitting: Orthopaedic Surgery

## 2018-10-27 ENCOUNTER — Ambulatory Visit (INDEPENDENT_AMBULATORY_CARE_PROVIDER_SITE_OTHER): Payer: Medicare Other | Admitting: Orthopaedic Surgery

## 2018-10-27 ENCOUNTER — Other Ambulatory Visit: Payer: Self-pay

## 2018-10-27 DIAGNOSIS — M25561 Pain in right knee: Secondary | ICD-10-CM

## 2018-10-27 DIAGNOSIS — G8929 Other chronic pain: Secondary | ICD-10-CM

## 2018-10-27 DIAGNOSIS — Z9889 Other specified postprocedural states: Secondary | ICD-10-CM

## 2018-10-27 MED ORDER — METHYLPREDNISOLONE ACETATE 40 MG/ML IJ SUSP
40.0000 mg | INTRAMUSCULAR | Status: AC | PRN
  Administered 2018-10-27: 40 mg via INTRA_ARTICULAR

## 2018-10-27 MED ORDER — LIDOCAINE HCL 1 % IJ SOLN
3.0000 mL | INTRAMUSCULAR | Status: AC | PRN
  Administered 2018-10-27: 3 mL

## 2018-10-27 NOTE — Progress Notes (Signed)
Office Visit Note   Patient: Caitlin Price           Date of Birth: 09-22-1950           MRN: [id number] Visit Date: 10/27/2018              Requested by: Wenda Low, MD [address] [place],  [place] [address] PCP: Wenda Low, MD   Assessment & Plan: Visit Diagnoses:  1. S/P right knee arthroscopy   2. Chronic pain of right knee     Plan: I was able to easily aspirate 20 cc of fluid off of her knee which was allowed for her small petite knee.  I then placed a steroid in the knee and gave her significant relief.  At this point I will have her work on quad strengthening exercises.  We will see her back in 4 weeks to see how she is doing overall.  All question concerns were answered and addressed.  Follow-Up Instructions: Return in about 4 weeks (around 11/24/2018).   Orders:  Orders Placed This Encounter  Procedures   Large Joint Inj   No orders of the defined types were placed in this encounter.     Procedures: Large Joint Inj: R knee on 10/27/2018 10:30 AM Indications: diagnostic evaluation and pain Details: 22 G 1.5 in needle, superolateral approach  Arthrogram: No  Medications: 3 mL lidocaine 1 %; 40 mg methylPREDNISolone acetate 40 MG/ML Outcome: tolerated well, no immediate complications Procedure, treatment alternatives, risks and benefits explained, specific risks discussed. Consent was given by the patient. Immediately prior to procedure a time out was called to verify the correct patient, procedure, equipment, support staff and site/side marked as required. Patient was prepped and draped in the usual sterile fashion.       Clinical Data: No additional findings.   Subjective: No chief complaint on file. The patient is continue to recover from a knee arthroscopy of the right knee.  We performed a partial medial meniscectomy.  She also had significant cartilage irregularities on the medial compartment of her knee.  She comes in  today about 5 weeks out from surgery of some swelling on her knee.  She is 68 years old and very active.  HPI  Review of Systems She currently denies any headache, chest pain, shortness of breath, fever, chills, nausea, vomiting  Objective: Vital Signs: There were no vitals taken for this visit.  Physical Exam She is alert and oriented x3 and in no acute distress Ortho Exam Exam of her right knee does show mild to moderate effusion.  Range of motion is full.  She does have some weakness in her quads.  Her knee is ligamentously stable. Specialty Comments:  No specialty comments available.  Imaging: No results found.   PMFS History: Patient Active Problem List   Diagnosis Date Noted   S/P right knee arthroscopy 10/06/2018   Status post arthroscopy of right shoulder 03/04/2017   Impingement syndrome of right shoulder 02/05/2017   Partial tear of right rotator cuff 02/05/2017   Chronic right shoulder pain 01/22/2017   Past Medical History:  Diagnosis Date   Cancer ([place]) 2016   skin cancer- forehead    Hyperlipidemia    Hypertension    Osteopenia     Family History  Problem Relation Age of Onset   Colon cancer Maternal Uncle    Heart attack Mother    Heart attack Father    Diabetes Sister  Diabetes Maternal Grandfather    Diabetes Paternal Grandmother     Past Surgical History:  Procedure Laterality Date   EXCISIONAL HEMORRHOIDECTOMY     ROTATOR CUFF REPAIR Right 02/21/2017   Social History   Occupational History   Not on file  Tobacco Use   Smoking status: Never Smoker   Smokeless tobacco: Never Used  Substance and Sexual Activity   Alcohol use: No    Alcohol/week: 0.0 standard drinks   Drug use: No   Sexual activity: Not Currently    Comment: 1st intercourse- 22, partners-  1,

## 2018-11-05 ENCOUNTER — Ambulatory Visit: Payer: Medicare Other | Admitting: Orthopaedic Surgery

## 2018-11-19 ENCOUNTER — Ambulatory Visit: Payer: Medicare Other | Admitting: Orthopaedic Surgery

## 2018-11-21 ENCOUNTER — Other Ambulatory Visit: Payer: Self-pay

## 2018-11-24 ENCOUNTER — Encounter: Payer: Self-pay | Admitting: Obstetrics & Gynecology

## 2018-11-24 ENCOUNTER — Other Ambulatory Visit: Payer: Self-pay

## 2018-11-24 ENCOUNTER — Ambulatory Visit (INDEPENDENT_AMBULATORY_CARE_PROVIDER_SITE_OTHER): Payer: Medicare Other | Admitting: Obstetrics & Gynecology

## 2018-11-24 VITALS — BP 140/80 | Ht 59.0 in | Wt 140.0 lb

## 2018-11-24 DIAGNOSIS — Z01419 Encounter for gynecological examination (general) (routine) without abnormal findings: Secondary | ICD-10-CM | POA: Diagnosis not present

## 2018-11-24 DIAGNOSIS — Z780 Asymptomatic menopausal state: Secondary | ICD-10-CM | POA: Diagnosis not present

## 2018-11-24 DIAGNOSIS — M8589 Other specified disorders of bone density and structure, multiple sites: Secondary | ICD-10-CM

## 2018-11-24 NOTE — Addendum Note (Signed)
Addended by: Thurnell Garbe A on: 11/24/2018 10:57 AM   Modules accepted: Orders

## 2018-11-24 NOTE — Patient Instructions (Signed)
1. Encounter for routine gynecological examination with Papanicolaou smear of cervix Normal gynecologic exam and menopause.  Pap reflex done.  Breast exam normal.  Screening mammogram July 2020 was negative.  Colonoscopy in 2016.  Body mass index 28.28.  Diabetes mellitus type 2 on Metformin.  Recommend a lower calorie/carb diet such as [place] while respecting diabetic diet recommendations.  Aerobic physical activities 5 times a week and weightlifting every 2 days.  Health labs with family physician.  2. Postmenopausal Well on no hormone replacement therapy.  No postmenopausal bleeding.  3. Osteopenia of multiple sites Continue vitamin D supplements, calcium intake of 1200 mg daily and regular weightbearing physical activities.  Caitlin Price, it was a pleasure seeing you today!  I will inform you of your results as soon as they are available.

## 2018-11-24 NOTE — Progress Notes (Signed)
Caitlin Price 1950-09-02 [id number]   History:    68 y.o. G0 Single.  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  RP:  Established patient presenting for annual gyn exam   HPI: Postmenopausal, well on no hormone replacement therapy.  No postmenopausal bleeding.  No pelvic pain.  Abstinent.  Urine and bowel movements normal.  Breasts normal.  Body mass index 28.28.  Not regularly physically active.  Diabetes mellitus under treatment with family physician.  Health labs with family physician.  Colonoscopy in 2016.  Past medical history,surgical history, family history and social history were all reviewed and documented in the EPIC chart.  Gynecologic History No LMP recorded. Patient is postmenopausal. Contraception: abstinence and post menopausal status Last Pap: 11/2017. Results were: Negative/HPV HR negative Last mammogram: 07/2018. Results were: Negative Bone Density: 07/2017 Osteopenia Colonoscopy: 2016  Obstetric History OB History  Gravida Para Term Preterm AB Living  0 0 0 0 0 0  SAB TAB Ectopic Multiple Live Births  0 0 0 0 0     ROS: A ROS was performed and pertinent positives and negatives are included in the history.  GENERAL: No fevers or chills. HEENT: No change in vision, no earache, sore throat or sinus congestion. NECK: No pain or stiffness. CARDIOVASCULAR: No chest pain or pressure. No palpitations. PULMONARY: No shortness of breath, cough or wheeze. GASTROINTESTINAL: No abdominal pain, nausea, vomiting or diarrhea, melena or bright red blood per rectum. GENITOURINARY: No urinary frequency, urgency, hesitancy or dysuria. MUSCULOSKELETAL: No joint or muscle pain, no back pain, no recent trauma. DERMATOLOGIC: No rash, no itching, no lesions. ENDOCRINE: No polyuria, polydipsia, no heat or cold intolerance. No recent change in weight. HEMATOLOGICAL: No anemia or easy bruising or bleeding. NEUROLOGIC: No headache, seizures, numbness, tingling or  weakness. PSYCHIATRIC: No depression, no loss of interest in normal activity or change in sleep pattern.     Exam:   BP 140/80   Ht 4\' 11"  (1.499 m)   Wt 140 lb (63.5 kg)   BMI 28.28 kg/m   Body mass index is 28.28 kg/m.  General appearance : Well developed well nourished female. No acute distress HEENT: Eyes: no retinal hemorrhage or exudates,  Neck supple, trachea midline, no carotid bruits, no thyroidmegaly Lungs: Clear to auscultation, no rhonchi or wheezes, or rib retractions  Heart: Regular rate and rhythm, no murmurs or gallops Breast:Examined in sitting and supine position were symmetrical in appearance, no palpable masses or tenderness,  no skin retraction, no nipple inversion, no nipple discharge, no skin discoloration, no axillary or supraclavicular lymphadenopathy Abdomen: no palpable masses or tenderness, no rebound or guarding Extremities: no edema or skin discoloration or tenderness  Pelvic: Vulva: Normal             Vagina: No gross lesions or discharge  Cervix: No gross lesions or discharge.  Pap reflex done.  Uterus  AV, normal size, shape and consistency, non-tender and mobile  Adnexa  Without masses or tenderness  Anus: Normal   Assessment/Plan:  68 y.o. female for annual exam   1. Encounter for routine gynecological examination with Papanicolaou smear of cervix Normal gynecologic exam and menopause.  Pap reflex done.  Breast exam normal.  Screening mammogram July 2020 was negative.  Colonoscopy in 2016.  Body mass index 28.28.  Diabetes mellitus type 2 on Metformin.  Recommend a lower calorie/carb diet such as [employer] while respecting diabetic diet recommendations.  Aerobic physical activities 5  times a week and weightlifting every 2 days.  Health labs with family physician.  2. Postmenopausal Well on no hormone replacement therapy.  No postmenopausal bleeding.  3. Osteopenia of multiple sites Continue vitamin D supplements, calcium intake of 1200  mg daily and regular weightbearing physical activities.  Princess Bruins MD, 10:23 AM 11/24/2018

## 2018-11-25 ENCOUNTER — Ambulatory Visit: Payer: Medicare Other | Admitting: Orthopaedic Surgery

## 2018-11-25 LAB — PAP IG W/ RFLX HPV ASCU

## 2018-11-26 ENCOUNTER — Encounter: Payer: Self-pay | Admitting: Orthopaedic Surgery

## 2018-11-26 ENCOUNTER — Other Ambulatory Visit: Payer: Self-pay

## 2018-11-26 ENCOUNTER — Ambulatory Visit (INDEPENDENT_AMBULATORY_CARE_PROVIDER_SITE_OTHER): Payer: Medicare Other | Admitting: Orthopaedic Surgery

## 2018-11-26 VITALS — Ht 59.0 in | Wt 143.0 lb

## 2018-11-26 DIAGNOSIS — Z9889 Other specified postprocedural states: Secondary | ICD-10-CM

## 2018-11-26 NOTE — Progress Notes (Signed)
The patient comes in today for continued follow-up 8 weeks after a right knee arthroscopy with partial medial meniscectomy.  She is a very active 68 year old female and her knee cartilage looked great other than a large meniscal tear.  She is very active and she appears much younger than her stated age and is way more active than her stated age.  Even the inside of her knee showed really good-looking cartilage.  At her last visit I did aspirate 20 cc of fluid from her knee and placed a steroid injection in her knee.  She still feels like the knee wants to give out on her.  On exam she still has just a mild effusion I was able to aspirate 10 to 12 cc of fluid from her knee that was just clear consistent with some postoperative changes.  The knee itself feels loose is stable.  I have recommended outpatient physical therapy here at [facility] so she can maximize her knee in terms of strengthening and other modalities they can hopefully improve her proprioception with that knee and decrease her pain and swelling.  She agrees to try outpatient physical therapy.  I will see her back myself in about 6 weeks.  We will work on getting this scheduled.

## 2018-11-27 ENCOUNTER — Other Ambulatory Visit: Payer: Self-pay

## 2018-11-27 DIAGNOSIS — G8929 Other chronic pain: Secondary | ICD-10-CM

## 2018-12-15 ENCOUNTER — Ambulatory Visit: Payer: Medicare Other | Admitting: Physical Therapy

## 2018-12-15 ENCOUNTER — Encounter: Payer: Self-pay | Admitting: Physical Therapy

## 2018-12-15 ENCOUNTER — Other Ambulatory Visit: Payer: Self-pay

## 2018-12-15 DIAGNOSIS — M6281 Muscle weakness (generalized): Secondary | ICD-10-CM | POA: Diagnosis not present

## 2018-12-15 DIAGNOSIS — M25561 Pain in right knee: Secondary | ICD-10-CM

## 2018-12-15 DIAGNOSIS — R600 Localized edema: Secondary | ICD-10-CM

## 2018-12-15 NOTE — Patient Instructions (Signed)
Access Code: [email or web address]  URL: [email or web address]  Date: 12/15/2018  Prepared by: Faustino Congress   Exercises Straight Leg Raise with External Rotation - 10 reps - 1 sets - 2-3 sec hold - 2x daily - 7x weekly Sidelying Hip Adduction - 10 reps - 1 sets - 2x daily - 7x weekly Sidelying Hip Extension in Abduction - 10 reps - 1 sets - 2x daily - 7x weekly Supine Bridge - 10 reps - 1 sets - 5 sec hold - 2x daily - 7x weekly Single Leg Bridge - 10 reps - 1 sets - 5 sec hold - 2x daily - 7x weekly Squat in Wide Stance with External Rotation - 10 reps - 1 sets - 2x daily - 7x weekly

## 2018-12-15 NOTE — Therapy (Signed)
[facility] [address] [place], [place], [address] Phone: [phone]   Fax:  [phone]  Physical Therapy Evaluation  Patient Details  Name: Caitlin Price MRN: [id number] Date of Birth: 04/16/1950 Referring Provider (PT): Mcarthur Rossetti, MD   Encounter Date: 12/15/2018  PT End of Session - 12/15/18 1006    Visit Number  1    Number of Visits  6    Date for PT Re-Evaluation  01/26/19    PT Start Time  0931    PT Stop Time  1004    PT Time Calculation (min)  33 min    Activity Tolerance  Patient tolerated treatment well    Behavior During Therapy  [facility] for tasks assessed/performed       Past Medical History:  Diagnosis Date  . Cancer ([place]) 2016   skin cancer- forehead   . Hyperlipidemia   . Hypertension   . Osteopenia     Past Surgical History:  Procedure Laterality Date  . EXCISIONAL HEMORRHOIDECTOMY    . KNEE SURGERY Right   . ROTATOR CUFF REPAIR Right 02/21/2017    There were no vitals filed for this visit.   Subjective Assessment - 12/15/18 0933    Subjective  Pt is a 68 y/o female who presents to OPPT s/p Rt knee scope with partial medial meniscectomy on 09/25/18.  Pt reports initial injury occured in Feb, and due to pandemic unable to have surgery until Sept.  Pt presents today with continued swelling, and decreased strength.    Limitations  Standing;Walking    How long can you walk comfortably?  many hours    Patient Stated Goals  improve strength    Currently in Pain?  Yes    Pain Score  3     Pain Location  Knee    Pain Orientation  Right    Pain Descriptors / Indicators  Aching;Dull    Pain Type  Surgical pain;Acute pain    Pain Onset  More than a month ago    Pain Frequency  Intermittent    Aggravating Factors   standing/walking all day    Pain Relieving Factors  nothing         [facility] PT Assessment - 12/15/18 0937      Assessment   Medical Diagnosis   S/P right knee arthroscopy with partial  medial meniscectomy     Referring Provider (PT)  Mcarthur Rossetti, MD    Onset Date/Surgical Date  09/25/18    Hand Dominance  Right    Next MD Visit  01/07/19    Prior Therapy  following shoulder injury      Precautions   Precautions  None      Restrictions   Weight Bearing Restrictions  No      Balance Screen   Has the patient fallen in the past 6 months  No    Has the patient had a decrease in activity level because of a fear of falling?   No    Is the patient reluctant to leave their home because of a fear of falling?   No      Home Environment   Living Environment  Private residence    Living Arrangements  Alone    Type of [place] Access  Level entry    [place]  One level      Prior Function   Level of [place]  Retired    Biomedical scientist  retired from [employer]  spend time with dogs, walking - otherwise no regular exercise      Cognition   Overall Cognitive Status  Within Functional Limits for tasks assessed      Observation/Other Assessments   Observations  Rt knee swelling present      ROM / Strength   AROM / PROM / Strength  AROM;Strength      AROM   AROM Assessment Site  Knee    Right/Left Knee  Right;Left    Right Knee Extension  0    Right Knee Flexion  130    Left Knee Extension  0    Left Knee Flexion  134      Strength   Strength Assessment Site  Hip;Knee    Right/Left Hip  Right;Left    Right Hip Flexion  4/5    Right Hip Extension  4/5    Right Hip ABduction  3+/5    Right Hip ADduction  3+/5    Left Hip Flexion  5/5    Left Hip Extension  4/5    Left Hip ABduction  4/5    Right/Left Knee  Right;Left    Right Knee Flexion  4/5    Right Knee Extension  5/5   poor VMO activation   Left Knee Flexion  5/5    Left Knee Extension  5/5    Right/Left Ankle  --      Palpation   Patella mobility  lateral tracking noted on Rt                Objective  measurements completed on examination: See above findings.      [facility] PT Treatment/Exercise - 12/15/18 1005      Exercises   Exercises  Knee/Hip      Knee/Hip Exercises: Standing   Functional Squat  5 reps    Functional Squat Limitations  wide stance with external rotation      Knee/Hip Exercises: Supine   Bridges  Both;5 reps    Single Leg Bridge  Right   x3 reps   Straight Leg Raise with External Rotation  Right;5 reps      Knee/Hip Exercises: Sidelying   Hip ABduction  Right;5 reps    Hip ABduction Limitations  with extension    Hip ADduction  Right;5 reps    Hip ADduction Limitations  cues to maintain quad activation             PT Education - 12/15/18 1006    Education Details  HEP    Person(s) Educated  Patient    Methods  Explanation;Demonstration;Handout    Comprehension  Verbalized understanding;Returned demonstration;Need further instruction          PT Long Term Goals - 12/15/18 1038      PT LONG TERM GOAL #1   Title  independent with HEP    Status  New    Target Date  01/26/19      PT LONG TERM GOAL #2   Title  improve RLE strength to 5/5 for improved function    Status  New    Target Date  01/26/19      PT LONG TERM GOAL #3   Title  report < 2/10 pain at end of day for decreased pain and improved function    Status  New    Target Date  01/26/19      PT  LONG TERM GOAL #4   Title  n/a      PT LONG TERM GOAL #5   Title  n/a             Plan - 12/15/18 0955    Clinical Impression Statement  Pt is a 68 y/o female who presents to OPPT s/p Rt knee scope with partial medial meniscectomy on 09/25/2018.  Pt overall doing very well, mainly with some strength and mild pain limitations.  Anticipate pt will do well with PT to address deficits and maximize function.    Examination-Activity Limitations  Stand;Squat;Stairs;Locomotion Level    Stability/Clinical Decision Making  Stable/Uncomplicated    Clinical Decision Making  Low     Rehab Potential  Good    PT Frequency  1x / week   anticipate biweekly   PT Duration  6 weeks    PT Treatment/Interventions  ADLs/Self Care Home Management;Cryotherapy;Electrical Stimulation;Moist Heat;Therapeutic exercise;Balance training;Therapeutic activities;Ultrasound;Gait training;Stair training;Functional mobility training;Neuromuscular re-education;Patient/family education;Scar mobilization;Manual techniques;Dry needling;Taping;Vasopneumatic Device    PT Next Visit Plan  review HEP and progress strengthening PRN    PT Home Exercise Plan  Access Code: [email or web address]    Consulted and Agree with Plan of Care  Patient       Patient will benefit from skilled therapeutic intervention in order to improve the following deficits and impairments:  Pain, Decreased strength, Increased edema  Visit Diagnosis: Acute pain of right knee - Plan: PT plan of care cert/re-cert  Muscle weakness (generalized) - Plan: PT plan of care cert/re-cert  Localized edema - Plan: PT plan of care cert/re-cert     Problem List Patient Active Problem List   Diagnosis Date Noted  . S/P right knee arthroscopy 10/06/2018  . Status post arthroscopy of right shoulder 03/04/2017  . Impingement syndrome of right shoulder 02/05/2017  . Partial tear of right rotator cuff 02/05/2017  . Chronic right shoulder pain 01/22/2017     Laureen Abrahams, PT, DPT 12/15/18 10:42 AM     [facility] Physical Therapy [address] [place], [place], [address] Phone: [phone]   Fax:  [phone]  Name: Caitlin Price MRN: [id number] Date of Birth: 1950-07-11

## 2018-12-30 ENCOUNTER — Encounter: Payer: Medicare Other | Admitting: Physical Therapy

## 2019-01-07 ENCOUNTER — Ambulatory Visit: Payer: Medicare Other | Admitting: Orthopaedic Surgery

## 2019-01-07 ENCOUNTER — Encounter: Payer: Self-pay | Admitting: Orthopaedic Surgery

## 2019-01-07 ENCOUNTER — Other Ambulatory Visit: Payer: Self-pay

## 2019-01-07 DIAGNOSIS — Z9889 Other specified postprocedural states: Secondary | ICD-10-CM

## 2019-01-07 DIAGNOSIS — M25561 Pain in right knee: Secondary | ICD-10-CM

## 2019-01-07 DIAGNOSIS — G8929 Other chronic pain: Secondary | ICD-10-CM

## 2019-01-07 NOTE — Progress Notes (Signed)
The patient is now 14 weeks status post a right knee arthroscopy.  She is 68 years old.  She actually had pretty good-looking cartilage in her knee but a large meniscal tear.  She continues to have recurrent effusion.  We have aspirated her knee twice now and did place a steroid injection in her knee in October.  She is a thin individual so it is certainly more noticeable when she does get an accumulation of her fluid on her knee.  She denies any pain at all.  She has been to therapy already and there is not much they can do for her because her function is still good with that knee and she is pain-free with good strength.  Following examination she does have still a mild effusion.  I was able to aspirate about 20 cc of clear fluid from her knee.  I told her this will likely continue to recur but would hopefully lessen the further she gets out from surgery.  I would like to see her back in 4 weeks to consider another aspiration again and then a repeat steroid injection.  I am not sure that hyaluronic acid would do anything for her since she is not really having any pain in the knee.  All question concerns were answered addressed.  We will see her when she looks like in 4 weeks.

## 2019-02-04 ENCOUNTER — Ambulatory Visit: Payer: Medicare Other | Admitting: Orthopaedic Surgery

## 2019-03-13 ENCOUNTER — Ambulatory Visit (INDEPENDENT_AMBULATORY_CARE_PROVIDER_SITE_OTHER): Payer: Medicare Other | Admitting: Podiatry

## 2019-03-13 ENCOUNTER — Encounter: Payer: Self-pay | Admitting: Podiatry

## 2019-03-13 ENCOUNTER — Other Ambulatory Visit: Payer: Self-pay

## 2019-03-13 DIAGNOSIS — M722 Plantar fascial fibromatosis: Secondary | ICD-10-CM | POA: Diagnosis not present

## 2019-03-13 NOTE — Progress Notes (Signed)
Subjective:   Patient ID: Caitlin Price, female   DOB: 69 y.o.   MRN: [id number]   HPI Patient states the right heel is sore and tender and patient needs another injection stating that it helped her quite a bit and its reoccurred recently neurovascular   ROS      Objective:  Physical Exam  Status intact with patient found to have discomfort in the mid plantar fascial at the insertion calcaneus and into the plantar heel     Assessment:  Plantar fasciitis right present but improved but still painful     Plan:  Sterile prep injected the plantar fascia right 3 mg Kenalog 5 mg Xylocaine advised on supportive shoes and reappoint as needed

## 2019-06-12 IMAGING — MR MRI OF THE RIGHT KNEE WITHOUT CONTRAST
4 of 6 series · 22 of 40 positions shown · non-contrast
Comparison: MRI right knee dated December 13, 2014.

CLINICAL DATA: Persistent knee pain since injury while walking her
dog in [REDACTED].

EXAM:
MRI OF THE RIGHT KNEE WITHOUT CONTRAST
TECHNIQUE: Multiplanar, multisequence MR imaging of the knee was performed. No
intravenous contrast was administered.

[Series 3: T2 fat-sat · axial · 4.0mm · 0.50mm/px · z∈[-50,+45]mm · 5 of 24 slices shown (1 of 2)]
[im 1/24]
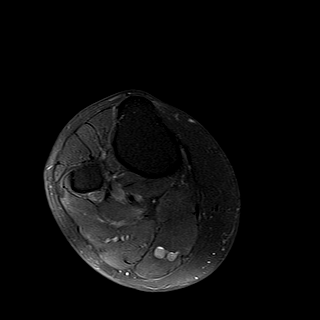
[im 4/24]
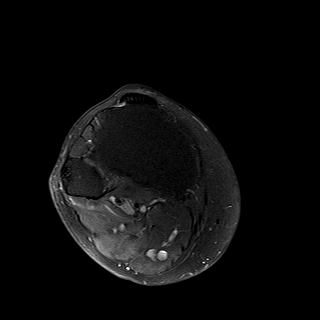
[im 8/24]
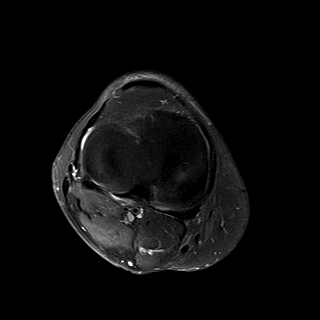
[im 12/24]
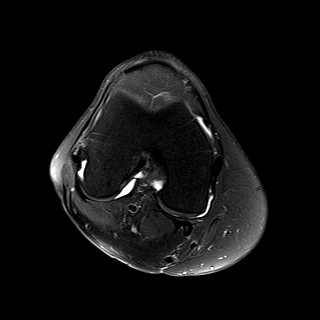
[im 20/24]
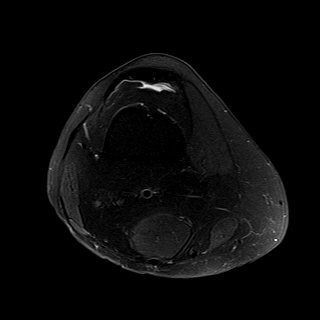

[Series 5: T2 fat-sat · coronal · 4.0mm · 0.29mm/px · 3 of 24 slices shown (2 of 2)]
[im 5/24]
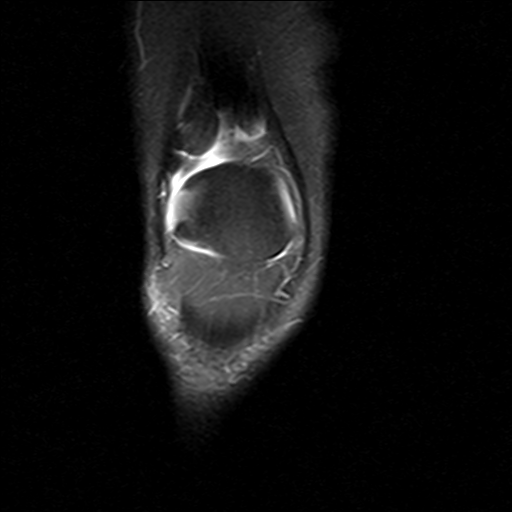
[im 14/24]
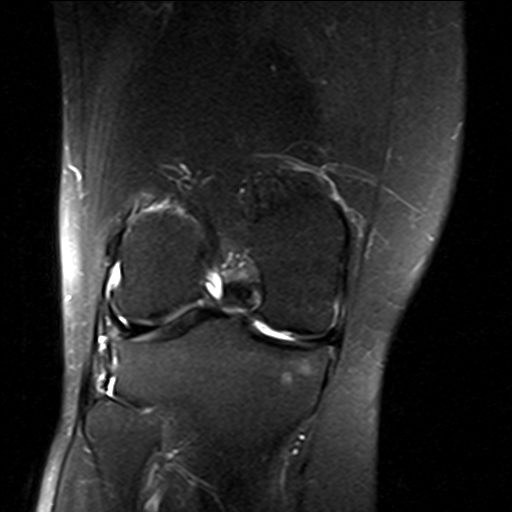
[im 24/24]
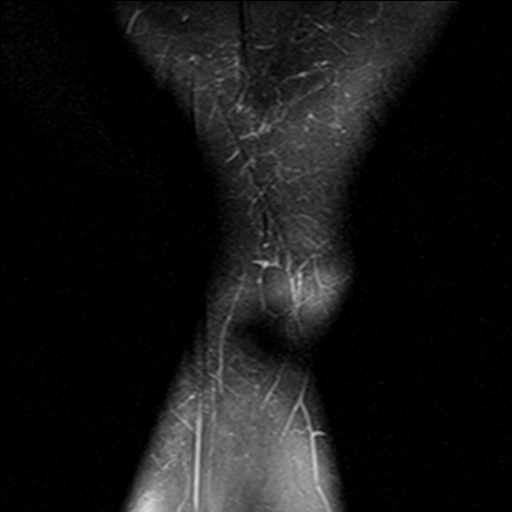

[Series 6: PD fat-sat · coronal · 3.0mm · 0.29mm/px · 8 of 30 slices shown (1 of 2)]
[im 1/30]
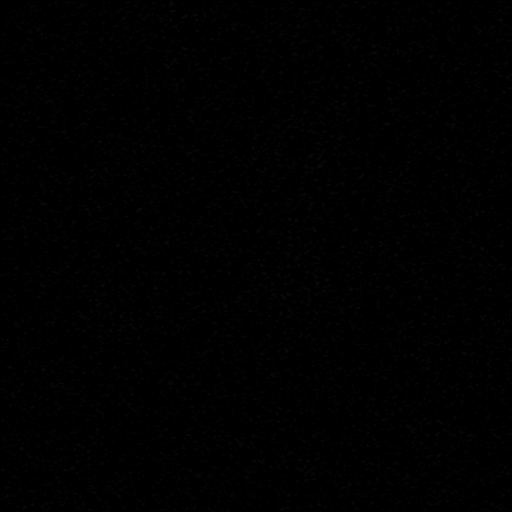
[im 5/30]
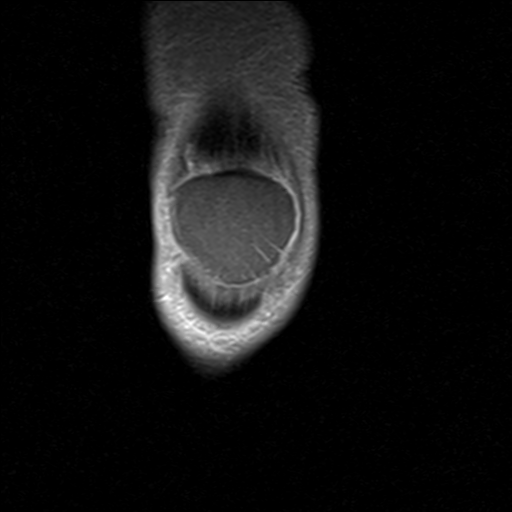
[im 9/30]
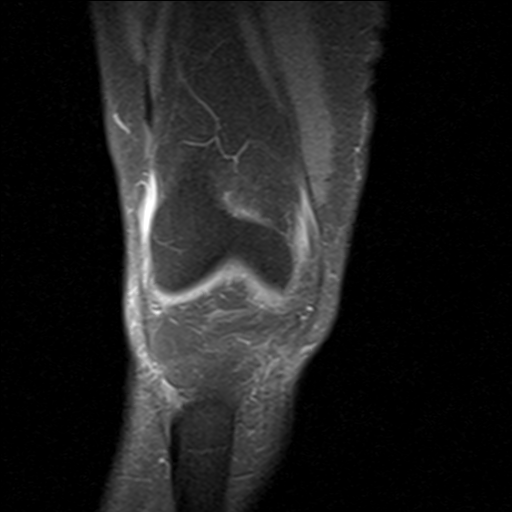
[im 13/30]
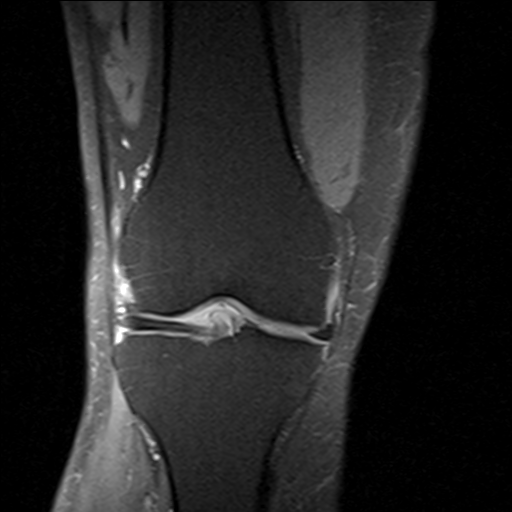
[im 17/30]
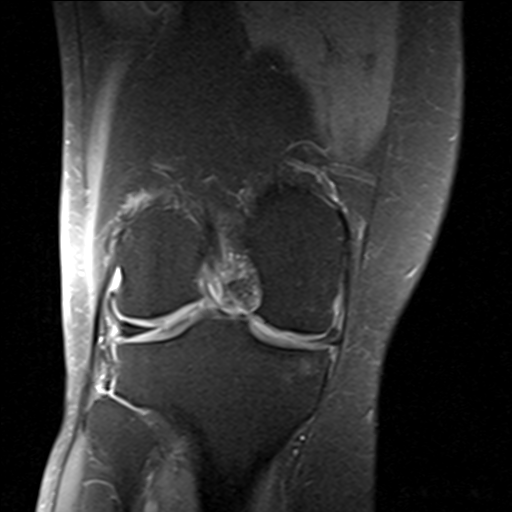
[im 21/30]
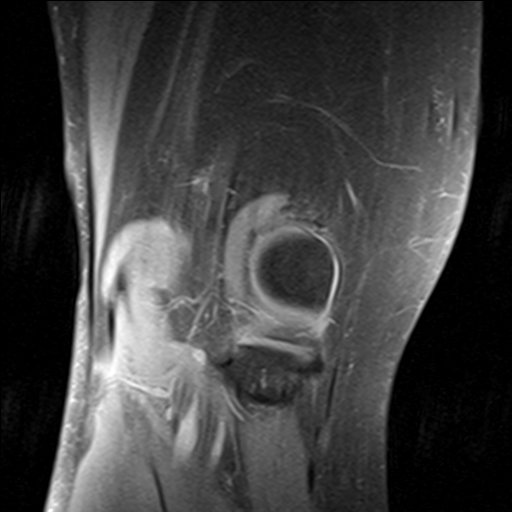
[im 25/30]
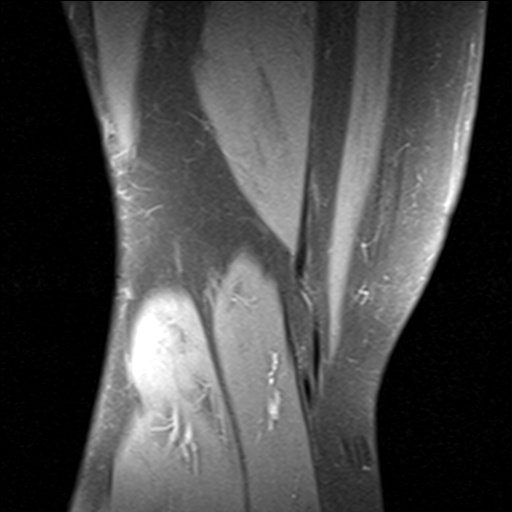
[im 30/30]
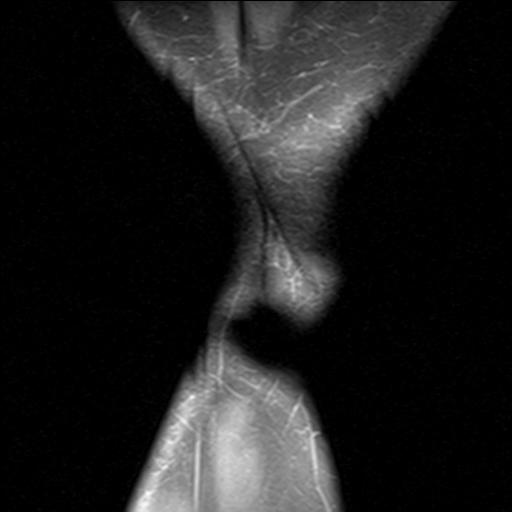

[Series 8: PD fat-sat · sagittal · 3.0mm · 0.29mm/px · 6 of 23 slices shown (2 of 2)]
[im 1/23]
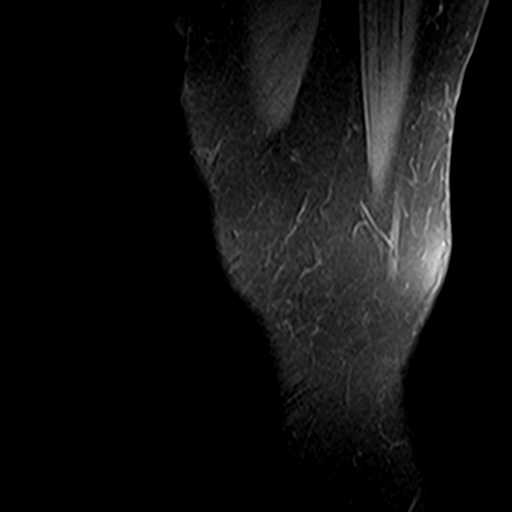
[im 5/23]
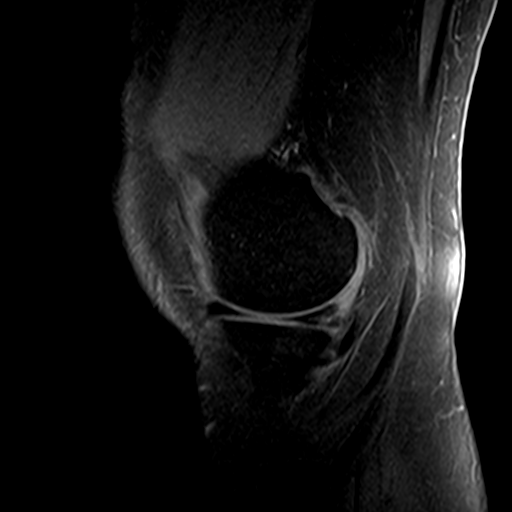
[im 9/23]
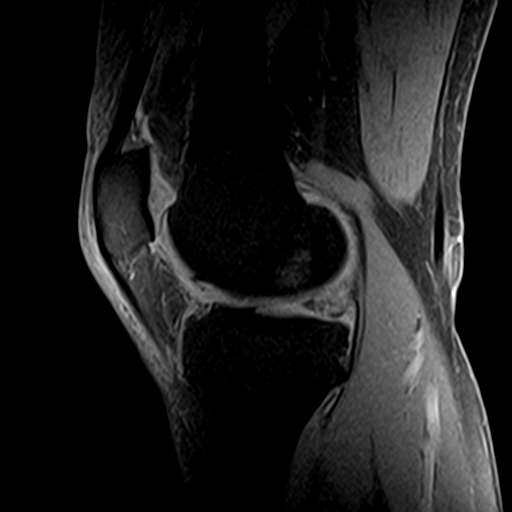
[im 14/23]
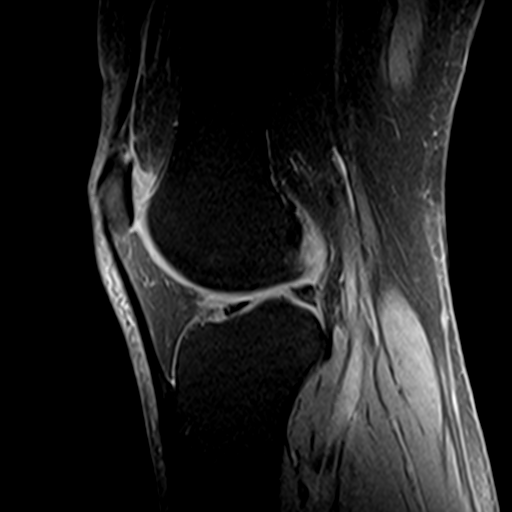
[im 18/23]
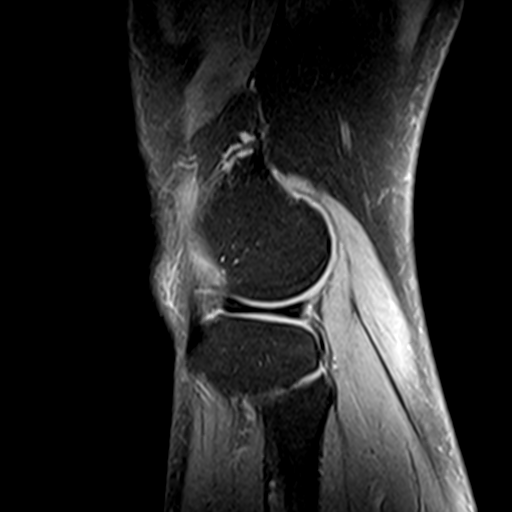
[im 23/23]
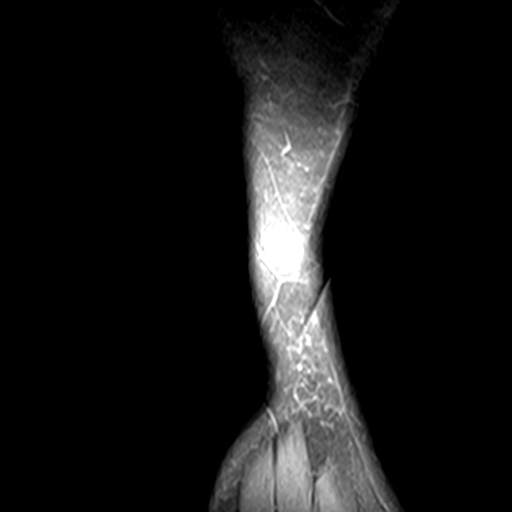

[22 of 40 positions shown; findings below may reference images not displayed]

FINDINGS: MENISCI

Medial meniscus: New complex tear of the posterior horn near the
root with radial and longitudinal components.

Lateral meniscus:  Intact.

LIGAMENTS

Cruciates:  Intact ACL and PCL.

Collaterals: Medial collateral ligament is intact. Lateral
collateral ligament complex is intact.

CARTILAGE

Patellofemoral: Unchanged mild partial-thickness cartilage loss over
the patella.

Medial:  Unchanged mild partial-thickness cartilage loss.

Lateral:  No chondral defect.

Joint: No significant joint effusion. Normal Hoffa's fat. No plical
thickening.

Popliteal Fossa: No significant Baker cyst. Intact popliteus tendon.

Extensor Mechanism: Intact quadriceps tendon and patellar tendon.
Intact medial and lateral patellar retinaculum. Intact MPFL.

Bones: No focal marrow signal abnormality. No fracture or
dislocation.

Other: None.
IMPRESSION: 1. New complex tear of the medial meniscus posterior horn near the
root.
2. Unchanged mild partial-thickness cartilage loss in the medial and
patellofemoral compartments.

## 2019-08-07 ENCOUNTER — Encounter: Payer: Self-pay | Admitting: Obstetrics & Gynecology

## 2019-11-26 ENCOUNTER — Ambulatory Visit (INDEPENDENT_AMBULATORY_CARE_PROVIDER_SITE_OTHER): Payer: Medicare Other | Admitting: Obstetrics & Gynecology

## 2019-11-26 ENCOUNTER — Other Ambulatory Visit: Payer: Self-pay

## 2019-11-26 ENCOUNTER — Encounter: Payer: Self-pay | Admitting: Obstetrics & Gynecology

## 2019-11-26 VITALS — BP 140/86 | Ht 58.5 in | Wt 138.0 lb

## 2019-11-26 DIAGNOSIS — Z780 Asymptomatic menopausal state: Secondary | ICD-10-CM | POA: Diagnosis not present

## 2019-11-26 DIAGNOSIS — M8589 Other specified disorders of bone density and structure, multiple sites: Secondary | ICD-10-CM | POA: Diagnosis not present

## 2019-11-26 DIAGNOSIS — Z01419 Encounter for gynecological examination (general) (routine) without abnormal findings: Secondary | ICD-10-CM

## 2019-11-26 NOTE — Progress Notes (Signed)
Caitlin Price 1950-09-20 [id number]   History:    69 y.o. G0 Single. 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  NW:[id number] presenting for annual gyn exam   XBM:[id number], well on no hormone replacement therapy. No postmenopausal bleeding. No pelvic pain. Abstinent. Urine and bowel movements normal. Breasts normal. Body mass index 28.35. Got her sister's dog, walking the dog every day. Diabetes mellitus under treatment with family physician. Health labs with family physician. Colonoscopy in 2016.  Past medical history,surgical history, family history and social history were all reviewed and documented in the EPIC chart.  Gynecologic History No LMP recorded. Patient is postmenopausal.  Obstetric History OB History  Gravida Para Term Preterm AB Living  0 0 0 0 0 0  SAB TAB Ectopic Multiple Live Births  0 0 0 0 0     ROS: A ROS was performed and pertinent positives and negatives are included in the history.  GENERAL: No fevers or chills. HEENT: No change in vision, no earache, sore throat or sinus congestion. NECK: No pain or stiffness. CARDIOVASCULAR: No chest pain or pressure. No palpitations. PULMONARY: No shortness of breath, cough or wheeze. GASTROINTESTINAL: No abdominal pain, nausea, vomiting or diarrhea, melena or bright red blood per rectum. GENITOURINARY: No urinary frequency, urgency, hesitancy or dysuria. MUSCULOSKELETAL: No joint or muscle pain, no back pain, no recent trauma. DERMATOLOGIC: No rash, no itching, no lesions. ENDOCRINE: No polyuria, polydipsia, no heat or cold intolerance. No recent change in weight. HEMATOLOGICAL: No anemia or easy bruising or bleeding. NEUROLOGIC: No headache, seizures, numbness, tingling or weakness. PSYCHIATRIC: No depression, no loss of interest in normal activity or change in sleep pattern.     Exam:   BP 140/86   Ht 4' 10.5" (1.486 m)   Wt 138 lb (62.6 kg)   BMI 28.35  kg/m   Body mass index is 28.35 kg/m.  General appearance : Well developed well nourished female. No acute distress HEENT: Eyes: no retinal hemorrhage or exudates,  Neck supple, trachea midline, no carotid bruits, no thyroidmegaly Lungs: Clear to auscultation, no rhonchi or wheezes, or rib retractions  Heart: Regular rate and rhythm, no murmurs or gallops Breast:Examined in sitting and supine position were symmetrical in appearance, no palpable masses or tenderness,  no skin retraction, no nipple inversion, no nipple discharge, no skin discoloration, no axillary or supraclavicular lymphadenopathy Abdomen: no palpable masses or tenderness, no rebound or guarding Extremities: no edema or skin discoloration or tenderness  Pelvic: Vulva: Normal             Vagina: No gross lesions or discharge  Cervix: No gross lesions or discharge  Uterus  AV, normal size, shape and consistency, non-tender and mobile  Adnexa  Without masses or tenderness  Anus: Normal   Assessment/Plan:  69 y.o. female for annual exam   1. Well female exam with routine gynecological exam Normal gynecologic exam in menopause.  No indication for Pap test this year.  Breast exam normal.  Screening mammogram July 2021 was negative.  Colonoscopy in 2016.  Health labs with family physician.  Body mass index 28.35.  Recommend a slightly lower calorie/carb diet.  Continue with daily walks with the dog.  2. Postmenopausal Well on no hormone replacement therapy.  No postmenopausal bleeding.  3. Osteopenia of multiple sites Vitamin D supplements, calcium intake of 1500 mg daily and regular weightbearing physical activities to continue.  Follow-up here for bone density. - DG Bone Density; Future  Other orders - lisinopril (ZESTRIL) 20 MG tablet; Take 20 mg by mouth daily.  Princess Bruins MD, 10:54 AM 11/26/2019

## 2019-12-16 ENCOUNTER — Ambulatory Visit (INDEPENDENT_AMBULATORY_CARE_PROVIDER_SITE_OTHER): Payer: Medicare Other

## 2019-12-16 ENCOUNTER — Other Ambulatory Visit: Payer: Self-pay | Admitting: Obstetrics & Gynecology

## 2019-12-16 ENCOUNTER — Other Ambulatory Visit: Payer: Self-pay

## 2019-12-16 DIAGNOSIS — Z780 Asymptomatic menopausal state: Secondary | ICD-10-CM

## 2019-12-16 DIAGNOSIS — M8589 Other specified disorders of bone density and structure, multiple sites: Secondary | ICD-10-CM

## 2020-01-20 DIAGNOSIS — L814 Other melanin hyperpigmentation: Secondary | ICD-10-CM | POA: Diagnosis not present

## 2020-01-20 DIAGNOSIS — D225 Melanocytic nevi of trunk: Secondary | ICD-10-CM | POA: Diagnosis not present

## 2020-01-20 DIAGNOSIS — Z85828 Personal history of other malignant neoplasm of skin: Secondary | ICD-10-CM | POA: Diagnosis not present

## 2020-01-20 DIAGNOSIS — L570 Actinic keratosis: Secondary | ICD-10-CM | POA: Diagnosis not present

## 2020-01-20 DIAGNOSIS — L821 Other seborrheic keratosis: Secondary | ICD-10-CM | POA: Diagnosis not present

## 2020-01-20 DIAGNOSIS — D692 Other nonthrombocytopenic purpura: Secondary | ICD-10-CM | POA: Diagnosis not present

## 2020-01-20 DIAGNOSIS — D1801 Hemangioma of skin and subcutaneous tissue: Secondary | ICD-10-CM | POA: Diagnosis not present

## 2020-02-22 ENCOUNTER — Other Ambulatory Visit: Payer: Self-pay | Admitting: Internal Medicine

## 2020-02-22 DIAGNOSIS — E782 Mixed hyperlipidemia: Secondary | ICD-10-CM

## 2020-03-22 ENCOUNTER — Other Ambulatory Visit: Payer: Medicare Other

## 2020-04-18 DIAGNOSIS — H2511 Age-related nuclear cataract, right eye: Secondary | ICD-10-CM | POA: Diagnosis not present

## 2020-04-18 DIAGNOSIS — H04123 Dry eye syndrome of bilateral lacrimal glands: Secondary | ICD-10-CM | POA: Diagnosis not present

## 2020-04-18 DIAGNOSIS — H25812 Combined forms of age-related cataract, left eye: Secondary | ICD-10-CM | POA: Diagnosis not present

## 2020-04-18 DIAGNOSIS — H01004 Unspecified blepharitis left upper eyelid: Secondary | ICD-10-CM | POA: Diagnosis not present

## 2020-05-30 DIAGNOSIS — D044 Carcinoma in situ of skin of scalp and neck: Secondary | ICD-10-CM | POA: Diagnosis not present

## 2020-05-30 DIAGNOSIS — L905 Scar conditions and fibrosis of skin: Secondary | ICD-10-CM | POA: Diagnosis not present

## 2020-07-20 DIAGNOSIS — L814 Other melanin hyperpigmentation: Secondary | ICD-10-CM | POA: Diagnosis not present

## 2020-07-20 DIAGNOSIS — D2261 Melanocytic nevi of right upper limb, including shoulder: Secondary | ICD-10-CM | POA: Diagnosis not present

## 2020-07-20 DIAGNOSIS — D2262 Melanocytic nevi of left upper limb, including shoulder: Secondary | ICD-10-CM | POA: Diagnosis not present

## 2020-07-20 DIAGNOSIS — D692 Other nonthrombocytopenic purpura: Secondary | ICD-10-CM | POA: Diagnosis not present

## 2020-07-20 DIAGNOSIS — D1801 Hemangioma of skin and subcutaneous tissue: Secondary | ICD-10-CM | POA: Diagnosis not present

## 2020-07-20 DIAGNOSIS — Z85828 Personal history of other malignant neoplasm of skin: Secondary | ICD-10-CM | POA: Diagnosis not present

## 2020-07-20 DIAGNOSIS — L570 Actinic keratosis: Secondary | ICD-10-CM | POA: Diagnosis not present

## 2020-07-20 DIAGNOSIS — C4442 Squamous cell carcinoma of skin of scalp and neck: Secondary | ICD-10-CM | POA: Diagnosis not present

## 2020-08-01 ENCOUNTER — Other Ambulatory Visit: Payer: Self-pay | Admitting: Internal Medicine

## 2020-08-01 DIAGNOSIS — J309 Allergic rhinitis, unspecified: Secondary | ICD-10-CM | POA: Diagnosis not present

## 2020-08-01 DIAGNOSIS — Z8249 Family history of ischemic heart disease and other diseases of the circulatory system: Secondary | ICD-10-CM

## 2020-08-01 DIAGNOSIS — R7303 Prediabetes: Secondary | ICD-10-CM | POA: Diagnosis not present

## 2020-08-01 DIAGNOSIS — E039 Hypothyroidism, unspecified: Secondary | ICD-10-CM | POA: Diagnosis not present

## 2020-08-01 DIAGNOSIS — E782 Mixed hyperlipidemia: Secondary | ICD-10-CM | POA: Diagnosis not present

## 2020-08-01 DIAGNOSIS — I10 Essential (primary) hypertension: Secondary | ICD-10-CM | POA: Diagnosis not present

## 2020-08-10 DIAGNOSIS — C4442 Squamous cell carcinoma of skin of scalp and neck: Secondary | ICD-10-CM | POA: Diagnosis not present

## 2020-08-30 DIAGNOSIS — Z1231 Encounter for screening mammogram for malignant neoplasm of breast: Secondary | ICD-10-CM | POA: Diagnosis not present

## 2020-09-15 ENCOUNTER — Other Ambulatory Visit: Payer: Medicare Other

## 2020-09-19 DIAGNOSIS — L570 Actinic keratosis: Secondary | ICD-10-CM | POA: Diagnosis not present

## 2020-09-29 DIAGNOSIS — Z08 Encounter for follow-up examination after completed treatment for malignant neoplasm: Secondary | ICD-10-CM | POA: Diagnosis not present

## 2020-10-03 DIAGNOSIS — Z85828 Personal history of other malignant neoplasm of skin: Secondary | ICD-10-CM | POA: Diagnosis not present

## 2020-10-03 DIAGNOSIS — L570 Actinic keratosis: Secondary | ICD-10-CM | POA: Diagnosis not present

## 2020-10-27 DIAGNOSIS — E039 Hypothyroidism, unspecified: Secondary | ICD-10-CM | POA: Diagnosis not present

## 2020-10-27 DIAGNOSIS — R7303 Prediabetes: Secondary | ICD-10-CM | POA: Diagnosis not present

## 2020-10-27 DIAGNOSIS — E782 Mixed hyperlipidemia: Secondary | ICD-10-CM | POA: Diagnosis not present

## 2020-10-27 DIAGNOSIS — M19049 Primary osteoarthritis, unspecified hand: Secondary | ICD-10-CM | POA: Diagnosis not present

## 2020-10-27 DIAGNOSIS — I10 Essential (primary) hypertension: Secondary | ICD-10-CM | POA: Diagnosis not present

## 2020-10-27 DIAGNOSIS — Z23 Encounter for immunization: Secondary | ICD-10-CM | POA: Diagnosis not present

## 2020-11-14 DIAGNOSIS — D485 Neoplasm of uncertain behavior of skin: Secondary | ICD-10-CM | POA: Diagnosis not present

## 2020-11-14 DIAGNOSIS — L905 Scar conditions and fibrosis of skin: Secondary | ICD-10-CM | POA: Diagnosis not present

## 2020-11-15 ENCOUNTER — Other Ambulatory Visit: Payer: Self-pay | Admitting: Internal Medicine

## 2020-11-15 DIAGNOSIS — R0989 Other specified symptoms and signs involving the circulatory and respiratory systems: Secondary | ICD-10-CM

## 2020-11-22 ENCOUNTER — Ambulatory Visit
Admission: RE | Admit: 2020-11-22 | Discharge: 2020-11-22 | Disposition: A | Discharge status: Home / Self Care | Payer: Medicare Other | Source: Ambulatory Visit | Attending: Internal Medicine | Admitting: Internal Medicine

## 2020-11-22 DIAGNOSIS — E119 Type 2 diabetes mellitus without complications: Secondary | ICD-10-CM | POA: Diagnosis not present

## 2020-11-22 DIAGNOSIS — R0989 Other specified symptoms and signs involving the circulatory and respiratory systems: Secondary | ICD-10-CM | POA: Diagnosis not present

## 2020-11-22 DIAGNOSIS — I10 Essential (primary) hypertension: Secondary | ICD-10-CM | POA: Diagnosis not present

## 2020-11-22 DIAGNOSIS — I998 Other disorder of circulatory system: Secondary | ICD-10-CM | POA: Diagnosis not present

## 2020-11-30 ENCOUNTER — Ambulatory Visit: Payer: Self-pay | Admitting: Obstetrics & Gynecology

## 2021-01-24 DIAGNOSIS — Z85828 Personal history of other malignant neoplasm of skin: Secondary | ICD-10-CM | POA: Diagnosis not present

## 2021-01-24 DIAGNOSIS — D1801 Hemangioma of skin and subcutaneous tissue: Secondary | ICD-10-CM | POA: Diagnosis not present

## 2021-01-24 DIAGNOSIS — D225 Melanocytic nevi of trunk: Secondary | ICD-10-CM | POA: Diagnosis not present

## 2021-01-24 DIAGNOSIS — L570 Actinic keratosis: Secondary | ICD-10-CM | POA: Diagnosis not present

## 2021-02-06 DIAGNOSIS — E039 Hypothyroidism, unspecified: Secondary | ICD-10-CM | POA: Diagnosis not present

## 2021-02-06 DIAGNOSIS — E782 Mixed hyperlipidemia: Secondary | ICD-10-CM | POA: Diagnosis not present

## 2021-02-06 DIAGNOSIS — M8588 Other specified disorders of bone density and structure, other site: Secondary | ICD-10-CM | POA: Diagnosis not present

## 2021-02-06 DIAGNOSIS — I10 Essential (primary) hypertension: Secondary | ICD-10-CM | POA: Diagnosis not present

## 2021-02-06 DIAGNOSIS — R7303 Prediabetes: Secondary | ICD-10-CM | POA: Diagnosis not present

## 2021-02-23 ENCOUNTER — Ambulatory Visit: Payer: Medicare Other | Admitting: Obstetrics & Gynecology

## 2021-03-30 ENCOUNTER — Ambulatory Visit: Payer: Medicare Other | Admitting: Obstetrics & Gynecology

## 2021-04-05 DIAGNOSIS — L739 Follicular disorder, unspecified: Secondary | ICD-10-CM | POA: Diagnosis not present

## 2021-04-05 DIAGNOSIS — D485 Neoplasm of uncertain behavior of skin: Secondary | ICD-10-CM | POA: Diagnosis not present

## 2021-04-05 DIAGNOSIS — L570 Actinic keratosis: Secondary | ICD-10-CM | POA: Diagnosis not present

## 2021-04-20 DIAGNOSIS — H2513 Age-related nuclear cataract, bilateral: Secondary | ICD-10-CM | POA: Diagnosis not present

## 2021-04-20 DIAGNOSIS — H5203 Hypermetropia, bilateral: Secondary | ICD-10-CM | POA: Diagnosis not present

## 2021-05-16 ENCOUNTER — Ambulatory Visit (INDEPENDENT_AMBULATORY_CARE_PROVIDER_SITE_OTHER): Payer: Medicare Other | Admitting: Obstetrics & Gynecology

## 2021-05-16 ENCOUNTER — Other Ambulatory Visit (HOSPITAL_COMMUNITY)
Admission: RE | Admit: 2021-05-16 | Discharge: 2021-05-16 | Disposition: A | Discharge status: Home / Self Care | Payer: Medicare Other | Source: Ambulatory Visit | Attending: Obstetrics & Gynecology | Admitting: Obstetrics & Gynecology

## 2021-05-16 ENCOUNTER — Encounter: Payer: Self-pay | Admitting: Obstetrics & Gynecology

## 2021-05-16 VITALS — BP 114/76 | HR 70 | Resp 16 | Ht 58.25 in | Wt 136.0 lb

## 2021-05-16 DIAGNOSIS — Z01419 Encounter for gynecological examination (general) (routine) without abnormal findings: Secondary | ICD-10-CM | POA: Insufficient documentation

## 2021-05-16 DIAGNOSIS — Z780 Asymptomatic menopausal state: Secondary | ICD-10-CM | POA: Diagnosis not present

## 2021-05-16 DIAGNOSIS — M8589 Other specified disorders of bone density and structure, multiple sites: Secondary | ICD-10-CM | POA: Diagnosis not present

## 2021-05-16 NOTE — Progress Notes (Signed)
? ? ?Caitlin Price 1950/04/04 [id number] ? ? ?History:    71 y.o. . G0 Single.  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 ?  ?RP:  Established patient presenting for annual gyn exam  ?  ?HPI: Postmenopausal, well on no hormone replacement therapy.  No postmenopausal bleeding.  No pelvic pain.  Abstinent.  Pap 11/2018 Neg.  Pap reflex today. Urine and bowel movements normal.  Breasts normal.  Mammo 08/2020 Neg.  Body mass index 28.18.  Got her sister's dog, walking the dog every day.  BD Osteopenia T-Score -1.7 in 12/2019.  Will repeat BD in 12/2021. Diabetes mellitus under treatment with family physician.  Health labs with family physician.  Colonoscopy in 2016. ?  ? ?Past medical history,surgical history, family history and social history were all reviewed and documented in the EPIC chart. ? ?Gynecologic History ?No LMP recorded. Patient is postmenopausal. ? ?Obstetric History ?OB History  ?Gravida Para Term Preterm AB Living  ?0 0 0 0 0 0  ?SAB IAB Ectopic Multiple Live Births  ?0 0 0 0 0  ? ? ? ?ROS: A ROS was performed and pertinent positives and negatives are included in the history. ? GENERAL: No fevers or chills. HEENT: No change in vision, no earache, sore throat or sinus congestion. NECK: No pain or stiffness. CARDIOVASCULAR: No chest pain or pressure. No palpitations. PULMONARY: No shortness of breath, cough or wheeze. GASTROINTESTINAL: No abdominal pain, nausea, vomiting or diarrhea, melena or bright red blood per rectum. GENITOURINARY: No urinary frequency, urgency, hesitancy or dysuria. MUSCULOSKELETAL: No joint or muscle pain, no back pain, no recent trauma. DERMATOLOGIC: No rash, no itching, no lesions. ENDOCRINE: No polyuria, polydipsia, no heat or cold intolerance. No recent change in weight. HEMATOLOGICAL: No anemia or easy bruising or bleeding. NEUROLOGIC: No headache, seizures, numbness, tingling or weakness. PSYCHIATRIC: No depression, no loss of interest in normal  activity or change in sleep pattern.  ?  ? ?Exam: ? ? ?BP 114/76   Pulse 70   Resp 16   Ht 4' 10.25" (1.48 m)   Wt 136 lb (61.7 kg)   BMI 28.18 kg/m?  ? ?Body mass index is 28.18 kg/m?. ? ?General appearance : Well developed well nourished female. No acute distress ?HEENT: Eyes: no retinal hemorrhage or exudates,  Neck supple, trachea midline, no carotid bruits, no thyroidmegaly ?Lungs: Clear to auscultation, no rhonchi or wheezes, or rib retractions  ?Heart: Regular rate and rhythm, no murmurs or gallops ?Breast:Examined in sitting and supine position were symmetrical in appearance, no palpable masses or tenderness,  no skin retraction, no nipple inversion, no nipple discharge, no skin discoloration, no axillary or supraclavicular lymphadenopathy ?Abdomen: no palpable masses or tenderness, no rebound or guarding ?Extremities: no edema or skin discoloration or tenderness ? ?Pelvic: Vulva: Normal ?            Vagina: No gross lesions or discharge ? Cervix: No gross lesions or discharge.  Pap reflex done. ? Uterus  AV, normal size, shape and consistency, non-tender and mobile ? Adnexa  Without masses or tenderness ? Anus: Normal ? ? ?Assessment/Plan:  71 y.o. female for annual exam  ? ?1. Encounter for routine gynecological examination with Papanicolaou smear of cervix ?Postmenopausal, well on no hormone replacement therapy.  No postmenopausal bleeding.  No pelvic pain.  Abstinent.  Pap 11/2018 Neg.  Pap reflex today. Urine and bowel movements normal. Breasts normal.  Mammo 08/2020 Neg.  Body mass index 28.18.  Got her sister's dog, walking the dog every day.  BD Osteopenia T-Score -1.7 in 12/2019.  Will repeat BD in 12/2021. Diabetes mellitus under treatment with family physician.  Health labs with family physician.  Colonoscopy in 2016. ? - Cytology - PAP( [place]) ? ?2. Postmenopausal ?Postmenopausal, well on no hormone replacement therapy.  No postmenopausal bleeding.  No pelvic pain.  Abstinent.  ?- DG  Bone Density; Future ? ?3. Osteopenia of multiple sites ?Body mass index 28.18.  Got her sister's dog, walking the dog every day.  BD Osteopenia T-Score -1.7 in 12/2019.  Will repeat BD in 12/2021.  Vit D, Ca++ total 1.5 g/d. ?- DG Bone Density; Future ? ?Other orders ?- simvastatin (ZOCOR) 20 MG tablet; SMARTSIG:1 Tablet(s) By Mouth Every Evening ?- CALCIUM PO; Take by mouth. Takes 2  ?Princess Bruins MD, 11:25 AM 05/16/2021 ? ?  ?

## 2021-05-17 LAB — CYTOLOGY - PAP
Adequacy: ABSENT
Diagnosis: NEGATIVE

## 2021-08-08 DIAGNOSIS — M8588 Other specified disorders of bone density and structure, other site: Secondary | ICD-10-CM | POA: Diagnosis not present

## 2021-08-08 DIAGNOSIS — G4700 Insomnia, unspecified: Secondary | ICD-10-CM | POA: Diagnosis not present

## 2021-08-08 DIAGNOSIS — I10 Essential (primary) hypertension: Secondary | ICD-10-CM | POA: Diagnosis not present

## 2021-08-08 DIAGNOSIS — E782 Mixed hyperlipidemia: Secondary | ICD-10-CM | POA: Diagnosis not present

## 2021-08-08 DIAGNOSIS — Z0000 Encounter for general adult medical examination without abnormal findings: Secondary | ICD-10-CM | POA: Diagnosis not present

## 2021-08-08 DIAGNOSIS — E039 Hypothyroidism, unspecified: Secondary | ICD-10-CM | POA: Diagnosis not present

## 2021-08-08 DIAGNOSIS — M722 Plantar fascial fibromatosis: Secondary | ICD-10-CM | POA: Diagnosis not present

## 2021-08-08 DIAGNOSIS — R7303 Prediabetes: Secondary | ICD-10-CM | POA: Diagnosis not present

## 2021-08-21 DIAGNOSIS — K130 Diseases of lips: Secondary | ICD-10-CM | POA: Diagnosis not present

## 2021-09-28 DIAGNOSIS — Z1231 Encounter for screening mammogram for malignant neoplasm of breast: Secondary | ICD-10-CM | POA: Diagnosis not present

## 2021-09-29 ENCOUNTER — Encounter: Payer: Self-pay | Admitting: Obstetrics & Gynecology

## 2021-10-17 DIAGNOSIS — L720 Epidermal cyst: Secondary | ICD-10-CM | POA: Diagnosis not present

## 2021-10-17 DIAGNOSIS — D225 Melanocytic nevi of trunk: Secondary | ICD-10-CM | POA: Diagnosis not present

## 2021-10-17 DIAGNOSIS — Z85828 Personal history of other malignant neoplasm of skin: Secondary | ICD-10-CM | POA: Diagnosis not present

## 2021-10-17 DIAGNOSIS — D1801 Hemangioma of skin and subcutaneous tissue: Secondary | ICD-10-CM | POA: Diagnosis not present

## 2021-10-17 DIAGNOSIS — D2271 Melanocytic nevi of right lower limb, including hip: Secondary | ICD-10-CM | POA: Diagnosis not present

## 2021-10-17 DIAGNOSIS — L570 Actinic keratosis: Secondary | ICD-10-CM | POA: Diagnosis not present

## 2021-10-17 DIAGNOSIS — D2272 Melanocytic nevi of left lower limb, including hip: Secondary | ICD-10-CM | POA: Diagnosis not present

## 2021-10-17 DIAGNOSIS — L821 Other seborrheic keratosis: Secondary | ICD-10-CM | POA: Diagnosis not present

## 2021-11-16 ENCOUNTER — Encounter: Payer: Self-pay | Admitting: Internal Medicine

## 2021-12-19 ENCOUNTER — Other Ambulatory Visit: Payer: Self-pay | Admitting: Obstetrics & Gynecology

## 2021-12-19 ENCOUNTER — Ambulatory Visit (INDEPENDENT_AMBULATORY_CARE_PROVIDER_SITE_OTHER): Payer: Medicare Other

## 2021-12-19 DIAGNOSIS — M85851 Other specified disorders of bone density and structure, right thigh: Secondary | ICD-10-CM

## 2021-12-19 DIAGNOSIS — Z13820 Encounter for screening for osteoporosis: Secondary | ICD-10-CM

## 2021-12-19 DIAGNOSIS — Z780 Asymptomatic menopausal state: Secondary | ICD-10-CM

## 2021-12-19 DIAGNOSIS — Z01419 Encounter for gynecological examination (general) (routine) without abnormal findings: Secondary | ICD-10-CM

## 2021-12-19 DIAGNOSIS — M8589 Other specified disorders of bone density and structure, multiple sites: Secondary | ICD-10-CM

## 2021-12-21 ENCOUNTER — Ambulatory Visit: Payer: Medicare Other | Admitting: Internal Medicine

## 2022-01-09 ENCOUNTER — Telehealth: Payer: Self-pay

## 2022-01-09 NOTE — Telephone Encounter (Signed)
Pt's sister calling to confirm the correct amounts and types of supplements she is taking is correct.  Written recommendations from ML on report state vit D, calcium 1.5 grams per day total (~'1500mg'$ ), and vitamin K2.   Pt's sister reports that Caitlin Price is currently taking a combo supplement of calcium('1200mg'$  per day) with vitamin D3 (1600IUs per day). Desires to know if needs more calcium and if D3 is ok or needs to do just vit D?  I advised her of the 149mg of vitamin K2 dose.  Please advise.

## 2022-01-11 NOTE — Telephone Encounter (Signed)
See Taylor's original message. Sister called back to correct vit D 3 dose. Patient is taking 1000 iu's of Vit D3 and then is getting 800 ius with her calcium tab and she takes two of those for a TOTAL 2600 Iu's of daily Vit D3.

## 2022-01-11 NOTE — Telephone Encounter (Signed)
Patient's sister called to correct the Vitamin D dose that patient is taking. However, what I heard in voice mail did not add up correctly so I left her a message to call me back and clarify.

## 2022-01-15 NOTE — Telephone Encounter (Signed)
Per Dr. Jacquiline Doe, Truddie Hidden, MD  to Me    01/14/22  1:00 PM Vit D at a total of 2,600 IU daily should be plenty, but to be certain, she needs a Vit D level.  Please schedule a lab visit for Vit D, unless she has a recent level elsewhere.

## 2022-01-15 NOTE — Telephone Encounter (Signed)
Spoke with patient's sister Arbie Cookey, per [facility] access note on file, and informed her of Dr. Mariah Milling recommendation to have VIT D level checked if not recently checked.  She will call back to schedule.  She asked if while patient is coming for Vit D level would it be of value to check her Calcium level as well?

## 2022-01-16 ENCOUNTER — Other Ambulatory Visit: Payer: Self-pay

## 2022-01-16 DIAGNOSIS — M8589 Other specified disorders of bone density and structure, multiple sites: Secondary | ICD-10-CM

## 2022-01-16 NOTE — Telephone Encounter (Signed)
Caitlin Bruins, MD  You1 hour ago (8:55 AM)   I would check Ca++ only if started on Prolia...  Ca++ in the blood is not a good reflexion of the intake. ML   Spoke with sister, Arbie Cookey, and informed her. Vit D order placed and patient will call back to schedule lab appt.

## 2022-01-18 ENCOUNTER — Other Ambulatory Visit: Payer: Medicare Other

## 2022-01-18 DIAGNOSIS — M8589 Other specified disorders of bone density and structure, multiple sites: Secondary | ICD-10-CM

## 2022-01-19 LAB — VITAMIN D 25 HYDROXY (VIT D DEFICIENCY, FRACTURES): Vit D, 25-Hydroxy: 52 ng/mL (ref 30–100)

## 2022-02-21 ENCOUNTER — Other Ambulatory Visit (HOSPITAL_COMMUNITY): Payer: Self-pay | Admitting: Internal Medicine

## 2022-02-21 DIAGNOSIS — I10 Essential (primary) hypertension: Secondary | ICD-10-CM | POA: Diagnosis not present

## 2022-02-21 DIAGNOSIS — E782 Mixed hyperlipidemia: Secondary | ICD-10-CM | POA: Diagnosis not present

## 2022-02-21 DIAGNOSIS — Z8249 Family history of ischemic heart disease and other diseases of the circulatory system: Secondary | ICD-10-CM | POA: Diagnosis not present

## 2022-02-21 DIAGNOSIS — E039 Hypothyroidism, unspecified: Secondary | ICD-10-CM | POA: Diagnosis not present

## 2022-02-21 DIAGNOSIS — R7303 Prediabetes: Secondary | ICD-10-CM | POA: Diagnosis not present

## 2022-02-21 DIAGNOSIS — E785 Hyperlipidemia, unspecified: Secondary | ICD-10-CM

## 2022-02-26 DIAGNOSIS — L820 Inflamed seborrheic keratosis: Secondary | ICD-10-CM | POA: Diagnosis not present

## 2022-02-26 DIAGNOSIS — L570 Actinic keratosis: Secondary | ICD-10-CM | POA: Diagnosis not present

## 2022-02-26 DIAGNOSIS — D485 Neoplasm of uncertain behavior of skin: Secondary | ICD-10-CM | POA: Diagnosis not present

## 2022-03-09 ENCOUNTER — Ambulatory Visit (HOSPITAL_COMMUNITY)
Admission: RE | Admit: 2022-03-09 | Discharge: 2022-03-09 | Disposition: A | Discharge status: Home / Self Care | Payer: Medicare Other | Source: Ambulatory Visit | Attending: Internal Medicine | Admitting: Internal Medicine

## 2022-03-09 DIAGNOSIS — E785 Hyperlipidemia, unspecified: Secondary | ICD-10-CM | POA: Insufficient documentation

## 2022-04-16 DIAGNOSIS — R197 Diarrhea, unspecified: Secondary | ICD-10-CM | POA: Diagnosis not present

## 2022-04-17 DIAGNOSIS — L821 Other seborrheic keratosis: Secondary | ICD-10-CM | POA: Diagnosis not present

## 2022-04-17 DIAGNOSIS — D225 Melanocytic nevi of trunk: Secondary | ICD-10-CM | POA: Diagnosis not present

## 2022-04-17 DIAGNOSIS — D1801 Hemangioma of skin and subcutaneous tissue: Secondary | ICD-10-CM | POA: Diagnosis not present

## 2022-04-17 DIAGNOSIS — Z85828 Personal history of other malignant neoplasm of skin: Secondary | ICD-10-CM | POA: Diagnosis not present

## 2022-04-18 DIAGNOSIS — R197 Diarrhea, unspecified: Secondary | ICD-10-CM | POA: Diagnosis not present

## 2022-04-30 DIAGNOSIS — E119 Type 2 diabetes mellitus without complications: Secondary | ICD-10-CM | POA: Diagnosis not present

## 2022-04-30 DIAGNOSIS — H04123 Dry eye syndrome of bilateral lacrimal glands: Secondary | ICD-10-CM | POA: Diagnosis not present

## 2022-04-30 DIAGNOSIS — H2513 Age-related nuclear cataract, bilateral: Secondary | ICD-10-CM | POA: Diagnosis not present

## 2022-04-30 DIAGNOSIS — H524 Presbyopia: Secondary | ICD-10-CM | POA: Diagnosis not present

## 2022-10-09 DIAGNOSIS — Z23 Encounter for immunization: Secondary | ICD-10-CM | POA: Diagnosis not present

## 2022-10-09 DIAGNOSIS — I10 Essential (primary) hypertension: Secondary | ICD-10-CM | POA: Diagnosis not present

## 2022-10-09 DIAGNOSIS — E039 Hypothyroidism, unspecified: Secondary | ICD-10-CM | POA: Diagnosis not present

## 2022-10-09 DIAGNOSIS — R7303 Prediabetes: Secondary | ICD-10-CM | POA: Diagnosis not present

## 2022-10-09 DIAGNOSIS — Z0000 Encounter for general adult medical examination without abnormal findings: Secondary | ICD-10-CM | POA: Diagnosis not present

## 2022-10-09 DIAGNOSIS — Z79899 Other long term (current) drug therapy: Secondary | ICD-10-CM | POA: Diagnosis not present

## 2022-10-09 DIAGNOSIS — E782 Mixed hyperlipidemia: Secondary | ICD-10-CM | POA: Diagnosis not present

## 2022-10-09 DIAGNOSIS — M8588 Other specified disorders of bone density and structure, other site: Secondary | ICD-10-CM | POA: Diagnosis not present

## 2022-10-15 DIAGNOSIS — D225 Melanocytic nevi of trunk: Secondary | ICD-10-CM | POA: Diagnosis not present

## 2022-10-15 DIAGNOSIS — D692 Other nonthrombocytopenic purpura: Secondary | ICD-10-CM | POA: Diagnosis not present

## 2022-10-15 DIAGNOSIS — D2272 Melanocytic nevi of left lower limb, including hip: Secondary | ICD-10-CM | POA: Diagnosis not present

## 2022-10-15 DIAGNOSIS — D2271 Melanocytic nevi of right lower limb, including hip: Secondary | ICD-10-CM | POA: Diagnosis not present

## 2022-10-15 DIAGNOSIS — D1801 Hemangioma of skin and subcutaneous tissue: Secondary | ICD-10-CM | POA: Diagnosis not present

## 2022-10-15 DIAGNOSIS — Z85828 Personal history of other malignant neoplasm of skin: Secondary | ICD-10-CM | POA: Diagnosis not present

## 2022-10-15 DIAGNOSIS — L570 Actinic keratosis: Secondary | ICD-10-CM | POA: Diagnosis not present

## 2022-10-15 DIAGNOSIS — L738 Other specified follicular disorders: Secondary | ICD-10-CM | POA: Diagnosis not present

## 2022-10-15 DIAGNOSIS — D485 Neoplasm of uncertain behavior of skin: Secondary | ICD-10-CM | POA: Diagnosis not present

## 2022-10-27 DIAGNOSIS — Z1231 Encounter for screening mammogram for malignant neoplasm of breast: Secondary | ICD-10-CM | POA: Diagnosis not present

## 2023-02-25 DIAGNOSIS — L570 Actinic keratosis: Secondary | ICD-10-CM | POA: Diagnosis not present

## 2023-02-25 DIAGNOSIS — D485 Neoplasm of uncertain behavior of skin: Secondary | ICD-10-CM | POA: Diagnosis not present

## 2023-04-15 DIAGNOSIS — L821 Other seborrheic keratosis: Secondary | ICD-10-CM | POA: Diagnosis not present

## 2023-04-15 DIAGNOSIS — D225 Melanocytic nevi of trunk: Secondary | ICD-10-CM | POA: Diagnosis not present

## 2023-04-15 DIAGNOSIS — D1801 Hemangioma of skin and subcutaneous tissue: Secondary | ICD-10-CM | POA: Diagnosis not present

## 2023-04-15 DIAGNOSIS — L570 Actinic keratosis: Secondary | ICD-10-CM | POA: Diagnosis not present

## 2023-04-15 DIAGNOSIS — D692 Other nonthrombocytopenic purpura: Secondary | ICD-10-CM | POA: Diagnosis not present

## 2023-04-15 DIAGNOSIS — Z85828 Personal history of other malignant neoplasm of skin: Secondary | ICD-10-CM | POA: Diagnosis not present

## 2023-04-15 DIAGNOSIS — D224 Melanocytic nevi of scalp and neck: Secondary | ICD-10-CM | POA: Diagnosis not present

## 2023-05-01 DIAGNOSIS — H5203 Hypermetropia, bilateral: Secondary | ICD-10-CM | POA: Diagnosis not present

## 2023-05-01 DIAGNOSIS — H2513 Age-related nuclear cataract, bilateral: Secondary | ICD-10-CM | POA: Diagnosis not present

## 2023-05-01 DIAGNOSIS — H25013 Cortical age-related cataract, bilateral: Secondary | ICD-10-CM | POA: Diagnosis not present

## 2023-05-01 DIAGNOSIS — H04123 Dry eye syndrome of bilateral lacrimal glands: Secondary | ICD-10-CM | POA: Diagnosis not present

## 2023-10-21 NOTE — Progress Notes (Addendum)
 Caitlin Price                                          MRN: [id number]   12/18/2023   The [facility] Quality Team Specialist reviewed this patient medical record for the purposes of chart review for care gap closure. The following were reviewed: chart review for care gap closure-kidney health evaluation for diabetes:uACR.    [facility] Quality Team

## 2023-10-31 ENCOUNTER — Encounter: Payer: Self-pay | Admitting: Gastroenterology

## 2023-11-14 ENCOUNTER — Encounter: Payer: Self-pay | Admitting: Internal Medicine

## 2023-11-14 ENCOUNTER — Ambulatory Visit: Admitting: Internal Medicine

## 2023-11-14 ENCOUNTER — Other Ambulatory Visit: Payer: Self-pay

## 2023-11-14 VITALS — BP 171/76 | HR 82 | Temp 98.6°F | Wt 132.0 lb

## 2023-11-14 DIAGNOSIS — A058 Other specified bacterial foodborne intoxications: Secondary | ICD-10-CM | POA: Diagnosis not present

## 2023-11-14 DIAGNOSIS — K529 Noninfective gastroenteritis and colitis, unspecified: Secondary | ICD-10-CM | POA: Diagnosis not present

## 2023-11-14 DIAGNOSIS — A498 Other bacterial infections of unspecified site: Secondary | ICD-10-CM | POA: Diagnosis not present

## 2023-11-14 NOTE — Patient Instructions (Addendum)
 Finish your vancomycin    For irritable bowel syndrome which can happen after infection -- Foods that trigger symptoms vary from person to person.  To ease IBS and SIBO symptoms, it's essential to avoid high FODMAP foods that aggravate the gut, including:  Dairy-based milk, yogurt and ice cream Wheat-based products such as cereal, bread and crackers Beans and lentils Some vegetables, such as artichokes, asparagus, onions and garlic Some fruits, such as apples, cherries, pears and peaches Instead, base your meals around low FODMAP foods such as:  Eggs and meat Certain cheeses such as brie, Camembert, cheddar and feta Almond milk Grains like rice, quinoa and oats Vegetables like eggplant, potatoes, tomatoes, cucumbers and zucchini Fruits such as grapes, oranges, strawberries, blueberries and pineapple Get a full list of FODMAP food from your doctor or nutritionist.

## 2023-11-14 NOTE — Progress Notes (Signed)
 [facility]  Reason for Consult:enterocolitis Referring Provider: Chrystal Collar    Patient Active Problem List   Diagnosis Date Noted   S/P right knee arthroscopy 10/06/2018   Status post arthroscopy of right shoulder 03/04/2017   Impingement syndrome of right shoulder 02/05/2017   Partial tear of right rotator cuff 02/05/2017   Chronic right shoulder pain 01/22/2017      HPI: Caitlin Price is a 73 y.o. female referred here for cdiff   10/26/23 ate some bbq (bought at a store -- ate over 3 days along with cold slaw), she then developed diarrhea a few days after ward up to 4 times a day. 6 days after onset diarrhea diarrhea was improving to about 1 times a day and more formed  Her pcp had patient done gi pcr which showed yersinia enterolitica and cdiff  She was started on po vancomycin, and will be finishing 2week course this weekend  She feels well now with baseline appetite  No fever chill No abd pain No malaise  Reports a couple episodes of blood where she wiped.   No recent antibiotics before this happened nor was there any hospital admission  No hx cdiff  No cbc/cmp  She has a dog that was noted to have some diarrhea and she had cleaned it up before diarrhea onset as well -- the vet had mentioned there was cdiff spore    Review of Systems: ROS All other ros negative      Past Medical History:  Diagnosis Date   Cancer (HCC) 2016   skin cancer- forehead    Hyperlipidemia    Hypertension    Osteopenia    Prediabetes     Social History   Tobacco Use   Smoking status: Never   Smokeless tobacco: Never  Vaping Use   Vaping status: Never Used  Substance Use Topics   Alcohol use: No    Alcohol/week: 0.0 standard drinks of alcohol   Drug use: No    Family History  Problem Relation Age of Onset   Colon cancer Maternal Uncle    Heart attack Mother    Heart attack Father    Diabetes Sister     Diabetes Maternal Grandfather    Diabetes Paternal Grandmother     Allergies  Allergen Reactions   Codeine     eyes puffy    OBJECTIVE: Vitals:   11/14/23 0903  BP: (!) 171/76  Pulse: 82  Temp: 98.6 F (37 C)  TempSrc: Oral  SpO2: 100%  Weight: 132 lb (59.9 kg)   Body mass index is 27.35 kg/m.   Physical Exam General/constitutional: no distress, pleasant HEENT: Normocephalic, PER, Conj Clear, EOMI, Oropharynx clear Neck supple CV: rrr no mrg Lungs: clear to auscultation, normal respiratory effort Abd: Soft, Nontender Ext: no edema Skin: No Rash Neuro: nonfocal MSK: no peripheral joint swelling/tenderness/warmth; back spines nontender   Lab:  Microbiology:  Serology:  Imaging:   Assessment/plan: Problem List Items Addressed This Visit   None Visit Diagnoses       Enterocolitis    -  Primary     Clostridioides difficile infection         Yersinia enterocolitica food poisoning             Patient with self limited diarrhea illness after eating bbq pork over several days and has yersinia on pcr more convincing that the limited diarrhea related to that  Cdiff likely  colonizer but already on treatment and she can finish  Dogs can get cdiff but again wouldn't worry about it now unless recurrent cdiff become an issue and then she can get treatment along with her dogs  Cleaning of carpet would be reasonable to use steam with antimicrobial solution that comes with carpet cleaner  For hard surface can clean with bleach  If recurrent diarrhea can f/u with me as needed   Discussed irritable bowel syndrome post gi infection to watch for Discussed possible small peripheral joint pain with yersnia infection and can take as needed tylenol/nsaids         Follow-up: Return if symptoms worsen or fail to improve.  Caitlin ONEIDA Passer, MD [facility] 11/14/2023, 9:23 AM

## 2023-11-28 ENCOUNTER — Ambulatory Visit: Admitting: Physician Assistant

## 2023-12-19 ENCOUNTER — Ambulatory Visit: Admitting: Gastroenterology
